# Patient Record
Sex: Male | Born: 1975 | Hispanic: Yes | Marital: Single | Smoking: Never smoker
Health system: Southern US, Community
[De-identification: ages and names within clinical notes are randomized; demographics above are authoritative.]

## PROBLEM LIST (undated history)

## (undated) DIAGNOSIS — E119 Type 2 diabetes mellitus without complications: Secondary | ICD-10-CM

---

## 2015-11-03 ENCOUNTER — Emergency Department (HOSPITAL_COMMUNITY)
Admission: EM | Admit: 2015-11-03 | Discharge: 2015-11-04 | Disposition: A | Discharge status: Home / Self Care | Payer: Self-pay | Attending: Physician Assistant | Admitting: Physician Assistant

## 2015-11-03 ENCOUNTER — Encounter (HOSPITAL_COMMUNITY): Payer: Self-pay

## 2015-11-03 DIAGNOSIS — R748 Abnormal levels of other serum enzymes: Secondary | ICD-10-CM | POA: Insufficient documentation

## 2015-11-03 DIAGNOSIS — R300 Dysuria: Secondary | ICD-10-CM | POA: Insufficient documentation

## 2015-11-03 DIAGNOSIS — R52 Pain, unspecified: Secondary | ICD-10-CM

## 2015-11-03 DIAGNOSIS — E119 Type 2 diabetes mellitus without complications: Secondary | ICD-10-CM | POA: Insufficient documentation

## 2015-11-03 DIAGNOSIS — N50819 Testicular pain, unspecified: Secondary | ICD-10-CM | POA: Insufficient documentation

## 2015-11-03 DIAGNOSIS — E139 Other specified diabetes mellitus without complications: Secondary | ICD-10-CM

## 2015-11-03 LAB — CBC
HEMATOCRIT: 43.8 % (ref 39.0–52.0)
HEMOGLOBIN: 15.9 g/dL (ref 13.0–17.0)
MCH: 33 pg (ref 26.0–34.0)
MCHC: 36.3 g/dL — AB (ref 30.0–36.0)
MCV: 90.9 fL (ref 78.0–100.0)
Platelets: 253 10*3/uL (ref 150–400)
RBC: 4.82 MIL/uL (ref 4.22–5.81)
RDW: 11.8 % (ref 11.5–15.5)
WBC: 5.1 10*3/uL (ref 4.0–10.5)

## 2015-11-03 LAB — COMPREHENSIVE METABOLIC PANEL
ALBUMIN: 4 g/dL (ref 3.5–5.0)
ALK PHOS: 85 U/L (ref 38–126)
ALT: 107 U/L — ABNORMAL HIGH (ref 17–63)
ANION GAP: 10 (ref 5–15)
AST: 108 U/L — ABNORMAL HIGH (ref 15–41)
BILIRUBIN TOTAL: 0.7 mg/dL (ref 0.3–1.2)
BUN: 11 mg/dL (ref 6–20)
CALCIUM: 9.1 mg/dL (ref 8.9–10.3)
CO2: 26 mmol/L (ref 22–32)
Chloride: 98 mmol/L — ABNORMAL LOW (ref 101–111)
Creatinine, Ser: 0.74 mg/dL (ref 0.61–1.24)
GFR calc Af Amer: >60 mL/min (ref >60)
GLUCOSE: 433 mg/dL — AB (ref 65–99)
POTASSIUM: 4.3 mmol/L (ref 3.5–5.1)
Sodium: 134 mmol/L — ABNORMAL LOW (ref 135–145)
TOTAL PROTEIN: 7 g/dL (ref 6.5–8.1)

## 2015-11-03 LAB — URINE MICROSCOPIC-ADD ON
RBC / HPF: NONE SEEN RBC/hpf (ref 0–5)
WBC, UA: NONE SEEN WBC/hpf (ref 0–5)

## 2015-11-03 LAB — URINALYSIS, ROUTINE W REFLEX MICROSCOPIC
BILIRUBIN URINE: NEGATIVE
Glucose, UA: >1000 mg/dL — AB
HGB URINE DIPSTICK: NEGATIVE
Ketones, ur: NEGATIVE mg/dL
Leukocytes, UA: NEGATIVE
Nitrite: NEGATIVE
PH: 6 (ref 5.0–8.0)
Protein, ur: NEGATIVE mg/dL
SPECIFIC GRAVITY, URINE: 1.04 — AB (ref 1.005–1.030)

## 2015-11-03 LAB — LIPASE, BLOOD: Lipase: 46 U/L (ref 11–51)

## 2015-11-03 NOTE — ED Notes (Signed)
Pt here with c/o of RLQ abdominal pain onset Sunday and pain radiates down to his testicles. Pt denies N/V/D but does report dysuria.

## 2015-11-04 ENCOUNTER — Emergency Department (HOSPITAL_COMMUNITY): Payer: Self-pay

## 2015-11-04 MED ORDER — FLUCONAZOLE 100 MG PO TABS
150.0000 mg | ORAL_TABLET | Freq: Once | ORAL | Status: AC
  Administered 2015-11-04: 150 mg via ORAL
  Filled 2015-11-04: qty 2

## 2015-11-04 NOTE — Discharge Instructions (Signed)
Tratamientos mdicos complementarios y alternativos para la diabetes (Complementary and Alternative Medical Therapies for Diabetes) Los medicamentos complementarios y alternativos son productos o prcticas de atencin mdica que no siempre se aceptan como parte de un medicamento de rutina. El medicamento complementario se [place] junto con el medicamento de rutina (tratamiento mdico). En ocasiones, el medicamento alternativo puede usarse en lugar del medicamento de [place]. Algunas personas usan estos mtodos para tratar la diabetes. Si bien algunos de estos tratamientos pueden ser eficaces, es posible que otros no lo sean. Incluso, algunos pueden ser perjudiciales. Los pacientes que usen estos mtodos deben informrselo al mdico. Es importante informar al mdico lo que est haciendo. Algunos de estos tratamientos se analizan a continuacin. Para obtener ms informacin, hable con su mdico. TRATAMIENTOS Acupuntura La acupuntura consiste en la insercin de agujas por parte de un profesional en determinados puntos de la piel. Algunos cientficos creen que esto desencadena la liberacin de los analgsicos naturales del cuerpo. Se ha demostrado que este tratamiento alivia el dolor prolongado (crnico), y puede ayudar a los pacientes que tienen lesiones nerviosas dolorosas a causa de la diabetes. Biorretroalimentacin La biorretroalimentacin ayuda a la persona a ser ms consciente de la respuesta que tiene el cuerpo al Merck & Co. Tambin lo ayuda a Software engineer. Este tratamiento alternativo se centra en las tcnicas de relajacin y reduccin del estrs. Una de las tcnicas consiste en generar imgenes mentales apacibles (visualizacin [place]). Algunas personas creen que ests imgenes pueden aliviar su afeccin. MEDICAMENTOS Cromo Diversos estudios [employer] suplementos de cromo pueden mejorar el control de la diabetes. El cromo [place] a Teaching laboratory technician de la [place]. Aunque las investigaciones  no han demostrado esto con Civil engineer, contracting. Los suplementos no se han recomendado ni aprobado. Debe tener precaucin si tiene problemas de rin (renales). Ginseng Hay diversos tipos de plantas de ginseng. El ginseng americano se [place] para los AMR Corporation la diabetes. Estos estudios han demostrado algunos efectos de reduccin de los niveles de [place]. Estos Bear Stearns niveles de glucosa en sangre se han observado en ayunas y despus de las comidas. Tambin se han observado en los niveles de A1c (niveles promedio de glucosa en sangre en un perodo de tres meses). Es necesario realizar ms estudios a largo plazo antes de Arts administrator para el uso de ginseng. Magnesio Durante Lexmark International, los expertos han estudiado la relacin que existe entre el magnesio y la diabetes. Sin embargo, an no se Corporate treasurer. Los MetLife sugieren que un nivel bajo de magnesio puede empeorar el control del nivel de glucosa en sangre en la diabetes tipo 2. Las investigaciones tambin demuestran que un nivel bajo puede generar determinadas complicaciones de la diabetes. Un estudio Jacobs Engineering personas que consumen ms magnesio tienen menos riesgo de sufrir de diabetes tipo 2. Ingerir cereales integrales, frutos secos y verduras de hojas verdes aumenta el nivel de [place]. Vanadio El vanadio es un compuesto que se encuentra en pequeas cantidades en las plantas y [place]. Los primeros estudios demostraron que el vanadio mejor los niveles de glucosa en sangre en los animales con diabetes tipo 1 y diabetes tipo 2. Un estudio Omnicare al recibir vanadio, los que tenan diabetes pudieron reducir la dosis de [place]. Los investigadores an deben aprender cmo funciona en el cuerpo, para descubrir los efectos secundarios y Editor, commissioning dosis seguras. Canela Se han realizado un par de estudios que parecen indicar que la canela reduce la resistencia a la insulina y la produccin de  insulina. Al hacerlo, puede  reducir el nivel de glucosa en [place]. Se desconocen las dosis exactas, pero puede tener un mejor efecto cuando se [place] junto con otros medicamentos para la diabetes.   Esta informacin no tiene Theme park manager el consejo del mdico. Asegrese de hacerle al mdico cualquier pregunta que tenga.   Document Released: 08/28/2013 Elsevier Interactive Patient Education 2016 ArvinMeritor. Control del nivel de glucosa en la sangre - Adultos (Blood Glucose Monitoring, Adult) El control de la glucosa en la sangre (tambin llamada azcar en la sangre) lo ayudar a tener la diabetes bajo control. Tambin ayuda a que usted y Lexicographer la diabetes y determinen si el tratamiento es Engineer, manufacturing. POR QU HAY QUE CONTROLAR LA GLUCOSA EN LA SANGRE?  Esto puede ayudar a comprender de United Stationers, la actividad fsica y los medicamentos inciden en los niveles de [place].  Le permite conocer el nivel de glucosa en la sangre en cualquier momento dado. Puede saber rpidamente si el nivel es bajo (hipoglucemia) o alto (hiperglucemia).  Puede ser de ayuda para que usted y el mdico sepan cmo Presenter, broadcasting,  y para entender cmo controlar una enfermedad o ajustar los medicamentos para hacer ejercicio. CUNDO DEBE HACERSE LAS PRUEBAS? El mdico lo ayudar a decidir con qu frecuencia deber AGCO Corporation niveles de glucosa en la [place]. Esto puede depender del tipo de diabetes que tenga, su control de la diabetes o los tipos de medicamentos que tome. Asegrese de anotar todos los valores de la glucosa en la [place], de modo que esta informacin pueda ser revisada por su mdico. A continuacin puede ver ejemplos de los momentos para Education officer, environmental la prueba que el mdico puede Neurosurgeon. Diabetes tipo1  Mdaselo al menos 2 veces al da si la diabetes est bien controlada, si [place] una bomba de insulina o si se aplica muchas inyecciones diarias.  Si la diabetes no est bien controlada o si est  enfermo, puede ser necesario que se controle con ms frecuencia.  Es recomendable que tambin lo mida en estas oportunidades:  Antes de cada inyeccin de insulina.  Antes y despus de hacer ejercicio.  Entre las comidas y 2horas despus de Arts administrator.  Ocasionalmente, entre las 2:00a.m. y las 3:00a.m. Diabetes tipo2  Si est utilizando insulina, realice la medicin al menos 2 veces al C.H. Robinson Worldwide. Sin embargo, es Insurance claims handler medicin antes de cada inyeccin de [place].  Si toma medicamentos por boca (va oral), hgase la prueba 2veces por da.  Si sigue una dieta controlada, hgase la prueba una vez por da.  Si la diabetes no est bien controlada o si est enfermo, puede ser necesario que se controle con ms frecuencia. CMO CONTROLAR EL NIVEL DE GLUCOSA EN LA SANGRE Insumos necesarios  Medidor de glucosa en la sangre.  Tiras reactivas para el medidor. Cada medidor tiene sus propias tiras reactivas. Debe usar las tiras reactivas correspondientes a su medidor.  Una aguja para pinchar (lanceta).  Un dispositivo que sujeta la lanceta (dispositivo de puncin).  Un diario o libro de anotaciones para YRC Worldwide. Procedimiento  Lave sus manos con agua y [place]. No se recomienda usar alcohol.  Pnchese el costado del dedo (no la punta) con Optometrist.  Apriete suavemente el dedo hasta que aparezca una pequea gota de [place].  Siga las instrucciones que vienen con el medidor para Public affairs consultant tira Firefighter, Contractor la sangre sobre la tira y usar el medidor de Horticulturist, commercial.  Otras zonas de las que se puede tomar sangre para la prueba Algunos medidores le permiten tomar sangre para la prueba de otras zonas del cuerpo (que no son el dedo). Estas reas se llaman sitios alternativos. Los sitios alternativos ms comunes son los siguientes:  El Product managerantebrazo.  El muslo.  La zona posterior de la parte inferior de la pierna.  La palma de la mano. El flujo de sangre en  estas zonas es ms lento. Por lo tanto, los valores de glucosa en la sangre que obtenga pueden estar demorados, y los nmeros son diferentes de los que obtiene de los dedos. No saque sangre de sitios alternativos si cree que tiene hipoglucemia. Los valores no sern precisos. Siempre extraiga del dedo si tiene hipoglucemia. Adems, si no puede darse cuenta cuando tiene bajos los niveles (hipoglucemia asintomtica), siempre extraiga sangre de los dedos para los controles de glucosa en la [place]. CONSEJOS ADICIONALES PARA EL CONTROL DE LA GLUCOSA  No vuelva a utilizar las lancetas.  Siempre tenga los insumos a mano.  Todos los medidores de glucosa incluyen un nmero de telfono "directo", disponible las 24 horas, al que podr llamar si tiene preguntas o French Southern Territoriesnecesita ayuda.  Ajuste (calibre) el medidor de glucosa con una solucin de control despus de terminar algunas cajas de tiras reactivas. LLEVE REGISTROS DE LOS NIVELES DE GLUCOSA EN LA SANGRE Es recomendable llevar un diario o un registro de los valores de glucosa en la [place]. La Harley-Davidsonmayora de los medidores de glucosa, sino todos, conservan el registro de la glucosa en el dispositivo. Algunos medidores permiten descargar los registros a su computadora. Llevar un registro de los valores de glucosa en la sangre es especialmente til si desea observar los patrones. Haga anotaciones simultneas con la Microbiologistlectura de los valores de glucosa en la sangre debido a que podra olvidar lo que ocurri en el momento exacto. Llevar un buen registro los ayudar a usted y al mdico a Printmakertrabajar juntos para Personnel officerlograr un buen control de la diabetes.    Esta informacin no tiene Theme park managercomo fin reemplazar el consejo del mdico. Asegrese de hacerle al mdico cualquier pregunta que tenga.   Document Released: 11/07/2005 Document Revised: 11/28/2014 Elsevier Interactive Patient Education Yahoo! Inc2016 Elsevier Inc.   Emergency Department Resource Guide 1) Find a Doctor and Pay Out of  Pocket Although you won't have to find out who is covered by your insurance plan, it is a good idea to ask around and get recommendations. You will then need to call the office and see if the doctor you have chosen will accept you as a new patient and what types of options they offer for patients who are self-pay. Some doctors offer discounts or will set up payment plans for their patients who do not have insurance, but you will need to ask so you aren't surprised when you get to your appointment.  2) Contact Your Local Health Department Not all health departments have doctors that can see patients for sick visits, but many do, so it is worth a call to see if yours does. If you don't know where your local health department is, you can check in your phone book. The CDC also has a tool to help you locate your state's health department, and many state websites also have listings of all of their local health departments.  3) Find a Walk-in Clinic If your illness is not likely to be very severe or complicated, you may want to try a walk in clinic. These are  popping up all over the country in pharmacies, drugstores, and shopping centers. They're usually staffed by nurse practitioners or physician assistants that have been trained to treat common illnesses and complaints. They're usually fairly quick and inexpensive. However, if you have serious medical issues or chronic medical problems, these are probably not your best option.  No Primary Care Doctor: - Call Health Connect at  [phone] - they can help you locate a primary care doctor that  accepts your insurance, provides certain services, etc. - Physician Referral Service- [phone]  Chronic Pain Problems: Organization         Address  Phone   Notes  [facility]  [phone] Patients need to be referred by their primary care doctor.   Medication Assistance: Organization         Address  Phone   Notes  [facility]  Medication [facility] [address] [place] [place], [place] [address] [phone] --Must be a resident of [facility] -- Must have NO insurance coverage whatsoever (no Medicaid/ Medicare, etc.) -- The pt. MUST have a primary care doctor that directs their care regularly and follows them in the community   MedAssist  [phone]   [employer]  [phone]    Agencies that provide inexpensive medical care: Organization         Address  Phone   Notes  [facility]  [phone]   [facility]    [phone]   [facility] [address] [place], [place] [address] [phone]   [facility] [address] [place], [place] [phone]   [facility]anned Parenthood    [phone]   [facility]    [phone]   [facility] and [facility]  [address], [place] Phone:  [phone], Fax:  [phone] Hours of Operation:  9 am - 6 pm, M-F.  Also accepts Medicaid/Medicare and self-pay.  [facility]  [address], [place] Phone: [phone], Fax: [phone]. Hours of Operation:  8:30 am - 5:30 pm, M-F.  Also accepts Medicaid and self-pay.  [facility] [place] [address] [place] Phone: [phone]   [facility] [address] Natasha Bence [place], [place] [phone], Ext. [phone] Mondays & Thursdays: 7-9 AM.  First 15 patients are seen on a first come, first serve basis.    Medicaid-accepting [facility] Providers:  Organization         Address  Phone   Notes  [facility] [address], [place] [phone] Also accepts self-pay patients.  [facility] [address] Laurell Josephs [place], [place]  [phone]   [facility] [address], [place] [phone]   [facility] [address] [place] [phone]   Renaye Rakers [address], [place]   [phone] Only accepts [place] Access [place] patients after they have their name applied to their card.   Self-Pay (no insurance) in [facility]:  Organization         Address  Phone   Notes  Sickle Cell Patients, [facility] [address] [place], [place] [phone]   [facility] [address] [place], [place] [phone]   [facility]  [address], [place] [phone]   [facility]/Dr. Osei-Bonsu  [address], [place] or [address], [place] [phone] Phone number for both [place] and [place] locations is the same.  [facility] and [facility] [address], [place] [phone]   [facility] [address], [place] or [address] [phone]   [facility] [address] [place] [phone], phone; [phone], fax Sees patients 1st and 3rd Saturday of every month.  Must not qualify for public or private insurance (i.e. Medicaid, Medicare, [place] Health Choice, Veterans' Benefits)  Household income should be no more than 200% of the poverty level The clinic cannot treat you if you are pregnant or think you are pregnant  Sexually transmitted diseases are not treated at the clinic.    Dental Care: Organization         Address  Phone  Notes  [facility] Department of [facility] [address] [place], [place] [phone] Accepts children up to age 78 who are enrolled in [place] or [place] Health Choice; pregnant women with a Medicaid card; and children who have applied for Medicaid or [place] Health Choice, but were declined, whose parents can pay a reduced fee at time of service.  [facility] Department of [facility]  [address], [place]  [phone] Accepts children up to age 84 who are enrolled in [place] or [place] Health Choice; pregnant women with a Medicaid card; and children who have applied for Medicaid or [place] Health Choice, but were declined, whose parents can pay a reduced fee at time of service.  [facility]  [address] [place], [place] [phone] Patients are seen by appointment only. Walk-ins are not accepted. [facility] will see patients 38 years of age and older. Monday - Tuesday (8am-5pm) Most Wednesdays (8:30-5pm) $30 per visit, cash only  [facility]  [address], [facility] [phone] Patients are seen by appointment only. Walk-ins are not accepted. [facility] will see patients 40 years of age and older. One Wednesday Evening (Monthly: Volunteer Based).  $30 per visit, cash only  [employer] of [employer]  [phone] for adults; Children under age 2, call Graduate Pediatric Dentistry at [phone]. Children aged 11-14, please call [phone] to request a pediatric application.  Dental services are provided in all areas of dental care including fillings, crowns and bridges, complete and partial dentures, implants, gum treatment, root canals, and extractions. Preventive care is also provided. Treatment is provided to both adults and children. Patients are selected via a lottery and there is often a waiting list.   [facility] [address] [place]  [phone] [email or web address]   [facility] [address] [place], [place] [phone], Ext. [phone] Second and Fourth Thursday of each month, opens at 6:30 AM; Clinic ends at 9 AM.  Patients are seen on a first-come first-served basis, and a limited number are seen during each clinic.   [facility]  [address] Ether [place] [employer], [place] [phone]   Eligibility Requirements You must have lived in [place], [place], or [place]  counties for at least the last three months.   You cannot be eligible for state or federal sponsored [place], including CIGNA, [place],  or Medicare.   You generally cannot be eligible for healthcare insurance through your employer.    How to apply: Eligibility screenings are held every Tuesday and Wednesday afternoon from 1:00 pm until 4:00 pm. You do not need an appointment for the interview!  [facility] [address], [place], [place] [phone]   [facility] Health Department  [phone]   [facility] Health Department  [phone]   [facility] Health Department  [phone]    Behavioral Health Resources in the Community: Intensive Outpatient Programs Organization         Address  Phone  Notes  [facility] [address], [place], [place] [phone]   [facility] Outpatient [address] [place], [place] [phone]   [facility]: [facility] [address] [place], [place]  [phone]   [facility] [address],  [place], [place] [phone] or [phone]   Substance Abuse Resources Organization         Address  Phone  Notes  Al[facility] Drug Services  [phone]   [facility]  [phone]   [facility] [place]  [phone]   Floydene Flock  [phone]   Residential & Outpatient Substance Abuse Program  [phone]   Psychological Services Organization         Address  Phone  Notes  [facility]  [phone]   [facility] Services  [phone]   [facility] Mental Health [address] [place] [phone] or [phone]    Mobile Crisis Teams Organization         Address  Phone  Notes  [facility], Mobile Crisis Care Unit  [phone]   [facility]  [address] [place], [place] [phone]   Doristine Locks [address] [place]  [place] [phone]    Self-Help/Support Groups Organization         Address  Phone             Notes  [facility] [place] - variety of support groups  [phone]- I7437963 Call for more information  Narcotics Anonymous (NA), [facility] [address], [employer] [place]  2 meetings at this location   Statistician         Address  Phone  Notes  [facility] [address],    [place]  [phone]   [facility]  [address], [place] [address], [place], [place] [phone]   [facility] [address] [place], [place] [phone] Admissions: 8am-3pm M-F  [facility] [address]    [place], [place] [phone]   [facility] [address] [place], [place], [place] [phone]   [facility] [address]  [place], [place] [phone]   [facility] - Intensive Outpatient [address] Laurell Josephs [address], [place], [place] [phone]   [facility] ([facility]iction [facility] [address] [place]  [place], [place] [phone] or [phone]   [facility] ([facility]) [address] [place], [place] [phone] Accepts Medicaid  [facility] [address],  [place] [phone] Substance Abuse/Addiction Treatment   [facility] Organization         Address  Phone  Notes  [facility]  [phone]   Angie Fava, PhD [address]yn Skeeters [place], [place]   [phone] or [phone]   [facility]  Behavioral   [address] [place], [place] [phone]   [facility] [address] [place], [place] [phone] Insurance/Medicaid/sponsorship through [facility] [address]                                    [place], [place] [phone] Therapy/tele-psych/case  [facility] [address]   The [place], [place] [phone]    Dr. Lolly Mustache  [phone]   [facility]  United Way [facility] Dept. 1) [address] [place] 2) [address], [place] 3)  [address], [place] [phone]  [phone]   [facility] Child Abuse Hotline 989-034-5731 or [phone] (After Hours)

## 2015-11-04 NOTE — ED Notes (Signed)
Pt verbalized understanding of d/c instructions and follow-up care. No further questions/concerns, VSS, ambulatory w/ steady gait (refused wheelchair) 

## 2015-11-04 NOTE — ED Provider Notes (Signed)
CSN: [id number]     Arrival date & time 11/03/15  2213 History  By signing my name below, I, Bethel Born, attest that this documentation has been prepared under the direction and in the presence of Arzella Rehmann Randall An, MD. Electronically Signed: Bethel Born, ED Scribe. 11/04/2015. 2:41 AM    Chief Complaint  Patient presents with  . Abdominal Pain    The history is provided by the patient. A language interpreter was used (A friend at bedside.).   Dillon Knight is a 39 y.o. male who presents to the Emergency Department complaining of new, intermittent, sharp, right lower abdominal pain with urinating onset 3 days ago. The pain radiates into the right testicle more than the left. When the pain is present he cannot urinate. Associated symptoms mild dysuria. Pt denies fever, nausea, and vomiting.   History reviewed. No pertinent past medical history. History reviewed. No pertinent past surgical history. No family history on file. Social History  Substance Use Topics  . Smoking status: Never Smoker   . Smokeless tobacco: None  . Alcohol Use: Yes    Review of Systems  Constitutional: Negative for fever.  Gastrointestinal: Positive for abdominal pain. Negative for nausea, vomiting and diarrhea.  Genitourinary: Positive for dysuria, difficulty urinating and testicular pain.  All other systems reviewed and are negative.  Allergies  Review of patient's allergies indicates no known allergies.  Home Medications   Prior to Admission medications   Not on File   BP 119/80 mmHg  Pulse 72  Temp(Src) 98.3 F (36.8 C) (Oral)  Resp 18  SpO2 96% Physical Exam  Constitutional: He is oriented to person, place, and time. He appears well-developed and well-nourished.  HENT:  Head: Normocephalic and atraumatic.  Eyes: EOM are normal.  Neck: Normal range of motion.  Cardiovascular: Normal rate, regular rhythm, normal heart sounds and intact distal pulses.    Pulmonary/Chest: Effort normal and breath sounds normal. No respiratory distress.  CTAB  Abdominal: Soft. He exhibits no distension. There is no tenderness.  Abdomen non tender.  Genitourinary:  Right groin tenderness.   Musculoskeletal: Normal range of motion.  Neurological: He is alert and oriented to person, place, and time.  Skin: Skin is warm and dry.  Psychiatric: He has a normal mood and affect. Judgment normal.  Nursing note and vitals reviewed.   ED Course  Procedures (including critical care time) DIAGNOSTIC STUDIES: Oxygen Saturation is 96% on RA,  normal by my interpretation.    COORDINATION OF CARE: 1:31 AM Discussed treatment plan with pt at bedside and pt agreed to plan.  Labs Review Labs Reviewed  COMPREHENSIVE METABOLIC PANEL - Abnormal; Notable for the following:    Sodium 134 (*)    Chloride 98 (*)    Glucose, Bld 433 (*)    AST 108 (*)    ALT 107 (*)    All other components within normal limits  CBC - Abnormal; Notable for the following:    MCHC 36.3 (*)    All other components within normal limits  URINALYSIS, ROUTINE W REFLEX MICROSCOPIC (NOT AT [facility]) - Abnormal; Notable for the following:    Specific Gravity, Urine 1.040 (*)    Glucose, UA >1000 (*)    All other components within normal limits  URINE MICROSCOPIC-ADD ON - Abnormal; Notable for the following:    Squamous Epithelial / LPF 0-5 (*)    Bacteria, UA RARE (*)    All other components within normal limits  LIPASE, BLOOD  Imaging Review [place] Scrotum  11/04/2015  CLINICAL DATA:  Acute onset of right lower quadrant abdominal pain, radiating down to the testes. Initial encounter. EXAM: SCROTAL ULTRASOUND DOPPLER ULTRASOUND OF THE TESTICLES TECHNIQUE: Complete ultrasound examination of the testicles, epididymis, and other scrotal structures was performed. Color and spectral Doppler ultrasound were also utilized to evaluate blood flow to the testicles. COMPARISON:  None. FINDINGS: Right  testicle Measurements: 4.6 x 2.6 x 2.7 cm. No mass or microlithiasis visualized. Left testicle Measurements: 4.3 x 2.4 x 2.9 cm. No mass or microlithiasis visualized. Right epididymis:  Normal in size and appearance. Left epididymis:  Normal in size and appearance. Hydrocele:  None visualized. Varicocele:  None visualized. Pulsed Doppler interrogation of both testes demonstrates normal low resistance arterial and venous waveforms bilaterally. IMPRESSION: Unremarkable scrotal ultrasound.  No evidence of testicular torsion. Electronically Signed   By: Roanna RaiderJeffery  Chang M.D.   On: 11/04/2015 03:39   [place] Art/ven Flow Abd Pelv Doppler  11/04/2015  CLINICAL DATA:  Acute onset of right lower quadrant abdominal pain, radiating down to the testes. Initial encounter. EXAM: SCROTAL ULTRASOUND DOPPLER ULTRASOUND OF THE TESTICLES TECHNIQUE: Complete ultrasound examination of the testicles, epididymis, and other scrotal structures was performed. Color and spectral Doppler ultrasound were also utilized to evaluate blood flow to the testicles. COMPARISON:  None. FINDINGS: Right testicle Measurements: 4.6 x 2.6 x 2.7 cm. No mass or microlithiasis visualized. Left testicle Measurements: 4.3 x 2.4 x 2.9 cm. No mass or microlithiasis visualized. Right epididymis:  Normal in size and appearance. Left epididymis:  Normal in size and appearance. Hydrocele:  None visualized. Varicocele:  None visualized. Pulsed Doppler interrogation of both testes demonstrates normal low resistance arterial and venous waveforms bilaterally. IMPRESSION: Unremarkable scrotal ultrasound.  No evidence of testicular torsion. Electronically Signed   By: Roanna RaiderJeffery  Chang M.D.   On: 11/04/2015 03:39   [place] Abdomen Limited Ruq  11/04/2015  CLINICAL DATA:  Acute onset of elevated LFTs.  Initial encounter. EXAM: US ABDOMEN LIMITED - RIGHT UPPER QUADRANT COMPARISON:  None. FINDINGS: Gallbladder: No gallstones or wall thickening visualized. No sonographic Murphy sign  noted. Common bile duct: Diameter: 0.3 cm, within normal limits in caliber. Liver: No focal lesion identified. Diffusely increased parenchymal echogenicity and coarsened echotexture, compatible with fatty infiltration. There is mild fatty sparing about the gallbladder fossa. IMPRESSION: 1. No acute abnormality seen at the right upper quadrant. 2. Diffuse fatty infiltration within the liver. Electronically Signed   By: Roanna RaiderJeffery  Chang M.D.   On: 11/04/2015 05:05   I have personally reviewed and evaluated these images and lab results as part of my medical decision-making.   EKG Interpretation None      MDM   Final diagnoses:  Pain  Elevated liver enzymes  Diabetes mellitus of other type without complication [facility](HCC)    Patient is very friendly 39 year old obese Hispanic male presenting with urinary symptoms. Patient has trouble urinating occasionally in pain with urination. Feels like the pain may be going into his right testicle. Although physical exam is normal we will get ultrasound of right testicle No abdomnal pain, maybe a tiny bit of groin pain, no evidence of hernia. He has had no swelling. . Labs showed elevated AST ALT, US of  Right Upper Quadrant Was Normal.  I'm concerned that the patient's symptoms may be from his diabetes. Patient has isolated blood sugar here of 488 which is consistent with a new guidelines for definition of diabetes. He has no anion gap and concerned about  DKA at this time. However patient has no follow-up.  We spnet lots of time to give him lots information about exercise and diet controlled diabetes. I'm concerned that his symptoms of urinary issues may be from a Candida infection. We will treat empirically here. Patient's eating and drinking normally, no fevers no abdominal pain.   I personally performed the services described in this documentation, which was scribed in my presence. The recorded information has been reviewed and is accurate.      Shalla Bulluck Randall An, MD 11/04/15 [phone]

## 2016-03-08 IMAGING — US US SCROTUM
1 series · 14 of 25 positions shown · non-contrast
Comparison: None.

CLINICAL DATA: Acute onset of right lower quadrant abdominal pain,
radiating down to the testes. Initial encounter.

EXAM:
SCROTAL ULTRASOUND
DOPPLER ULTRASOUND OF THE TESTICLES
TECHNIQUE: Complete ultrasound examination of the testicles, epididymis, and
other scrotal structures was performed. Color and spectral Doppler
ultrasound were also utilized to evaluate blood flow to the
testicles.

[Series 1: us scrotum · 0.06mm/px · 14 of 34 slices shown]
[im 1/34]
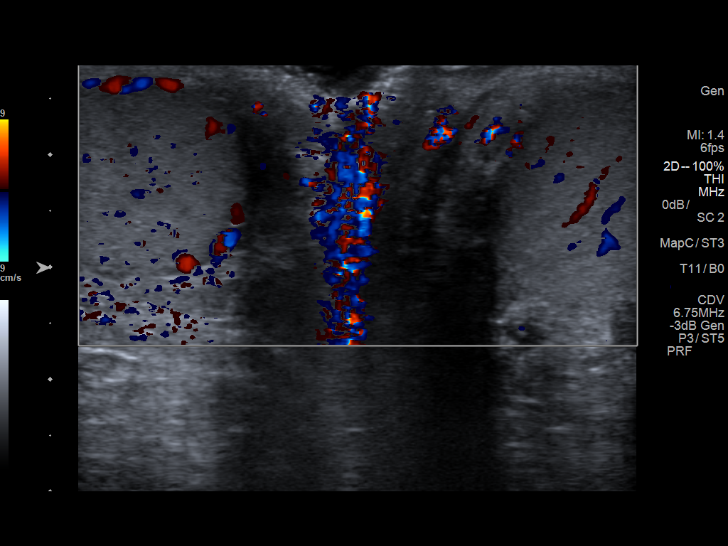
[im 3/34]
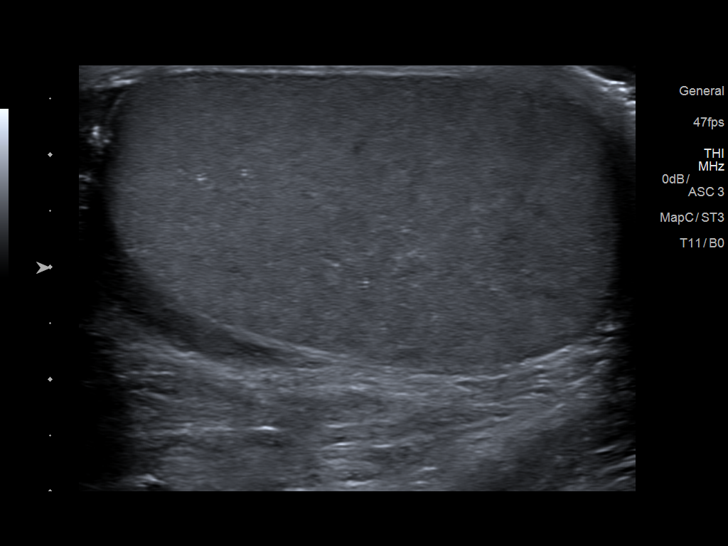
[im 6/34]
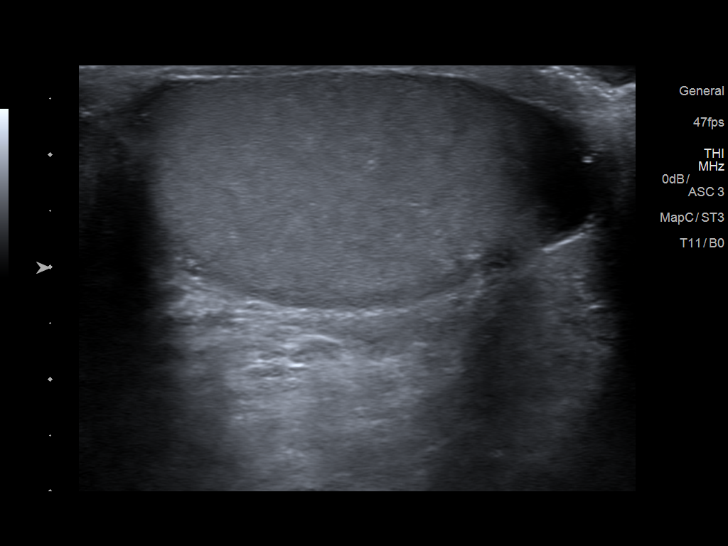
[im 9/34]
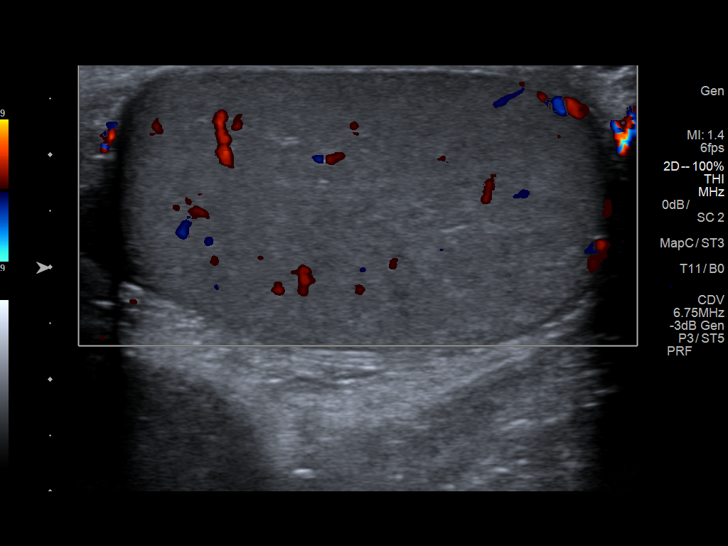
[im 12/34]
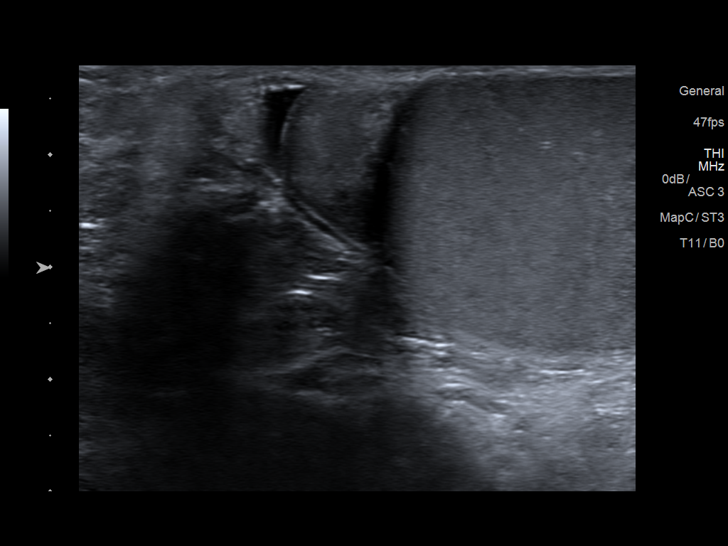
[im 13/34]
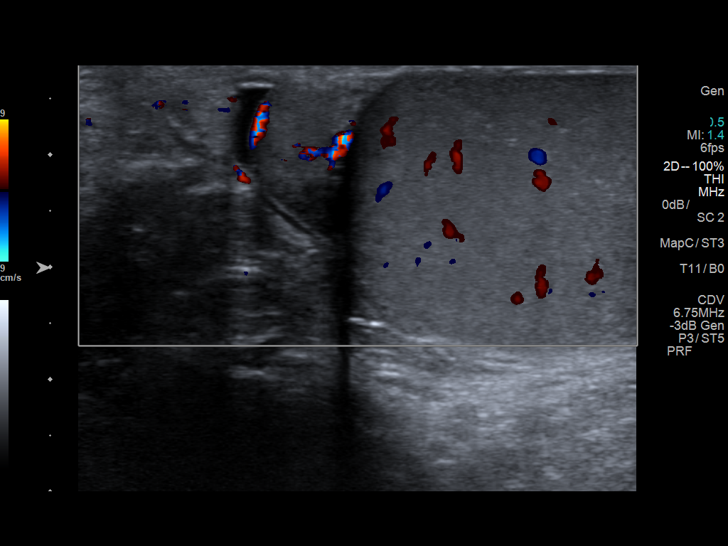
[im 16/34]
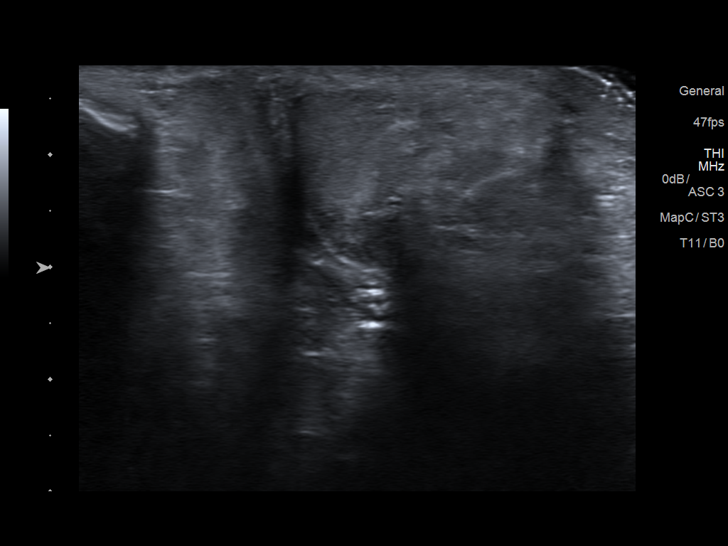
[im 18/34]
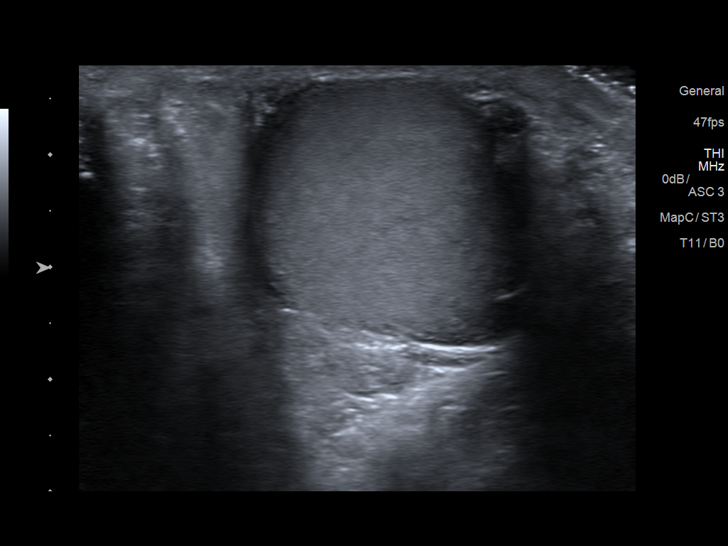
[im 21/34]
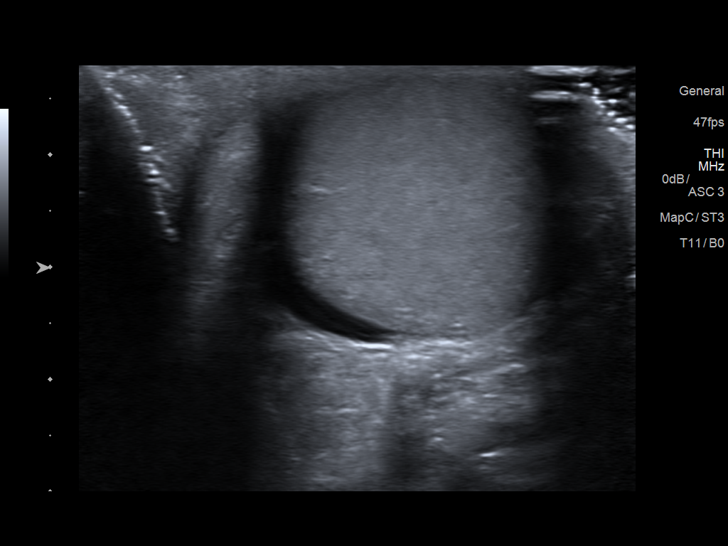
[im 23/34]
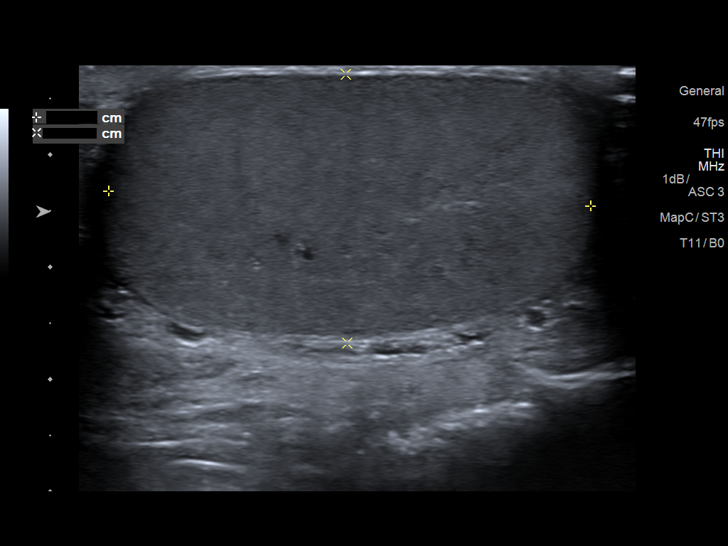
[im 25/34]
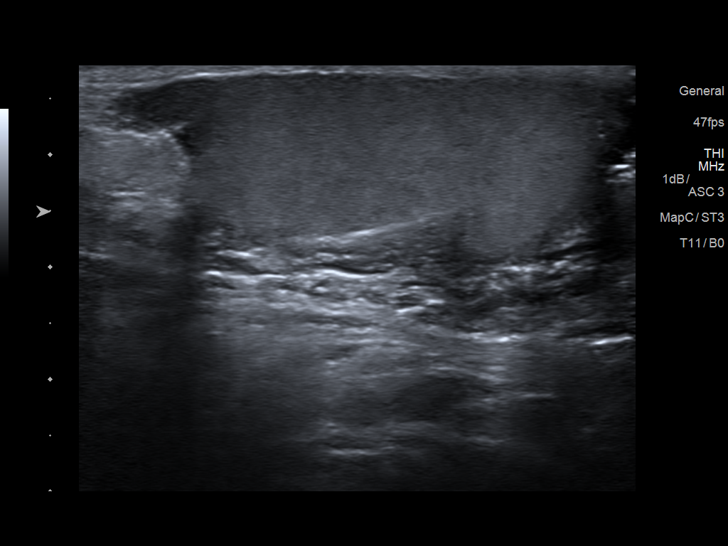
[im 28/34]
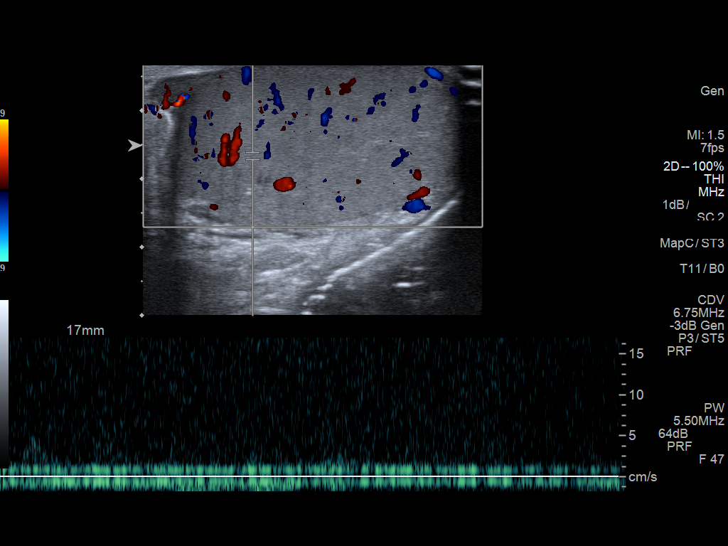
[im 31/34]
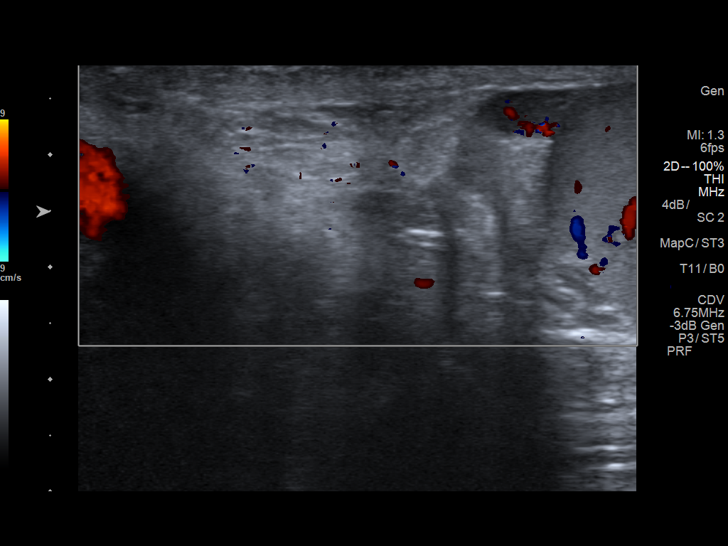
[im 34/34]
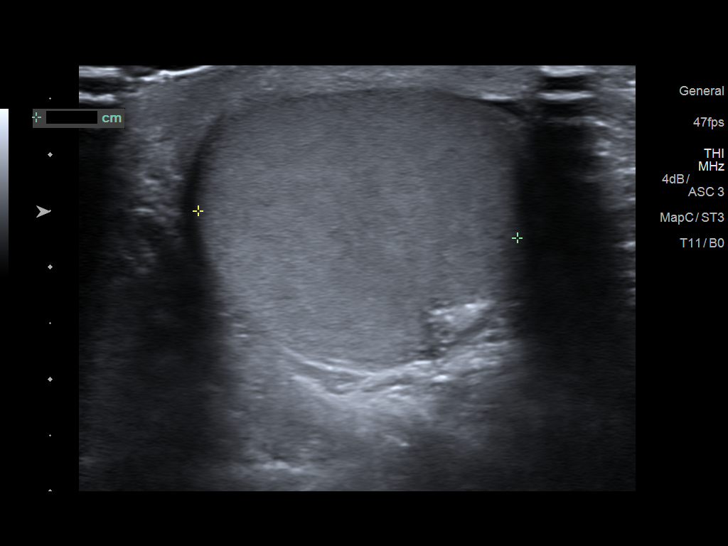

[14 of 25 positions shown; findings below may reference images not displayed]

FINDINGS: Right testicle

Measurements: 4.6 x 2.6 x 2.7 cm. No mass or microlithiasis
visualized.

Left testicle

Measurements: 4.3 x 2.4 x 2.9 cm. No mass or microlithiasis
visualized.

Right epididymis:  Normal in size and appearance.

Left epididymis:  Normal in size and appearance.

Hydrocele:  None visualized.

Varicocele:  None visualized.

Pulsed Doppler interrogation of both testes demonstrates normal low
resistance arterial and venous waveforms bilaterally.
IMPRESSION: Unremarkable scrotal ultrasound.  No evidence of testicular torsion.

## 2017-10-31 ENCOUNTER — Other Ambulatory Visit: Payer: Self-pay

## 2017-10-31 ENCOUNTER — Encounter (HOSPITAL_BASED_OUTPATIENT_CLINIC_OR_DEPARTMENT_OTHER): Payer: Self-pay | Admitting: Emergency Medicine

## 2017-10-31 ENCOUNTER — Emergency Department (HOSPITAL_BASED_OUTPATIENT_CLINIC_OR_DEPARTMENT_OTHER)
Admission: EM | Admit: 2017-10-31 | Discharge: 2017-10-31 | Disposition: A | Discharge status: Home / Self Care | Payer: No Typology Code available for payment source | Attending: Emergency Medicine | Admitting: Emergency Medicine

## 2017-10-31 DIAGNOSIS — Z7984 Long term (current) use of oral hypoglycemic drugs: Secondary | ICD-10-CM | POA: Diagnosis not present

## 2017-10-31 DIAGNOSIS — E119 Type 2 diabetes mellitus without complications: Secondary | ICD-10-CM | POA: Insufficient documentation

## 2017-10-31 DIAGNOSIS — M545 Low back pain: Secondary | ICD-10-CM | POA: Diagnosis not present

## 2017-10-31 DIAGNOSIS — M542 Cervicalgia: Secondary | ICD-10-CM | POA: Insufficient documentation

## 2017-10-31 HISTORY — DX: Type 2 diabetes mellitus without complications: E11.9

## 2017-10-31 MED ORDER — CYCLOBENZAPRINE HCL 10 MG PO TABS: 10.0000 mg | ORAL_TABLET | Freq: Two times a day (BID) | ORAL | 0 refills | Status: DC | PRN

## 2017-10-31 MED ORDER — NAPROXEN 500 MG PO TABS: 500.0000 mg | ORAL_TABLET | Freq: Two times a day (BID) | ORAL | 0 refills | Status: DC
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
## 2019-09-17 ENCOUNTER — Other Ambulatory Visit: Payer: Self-pay

## 2019-09-17 DIAGNOSIS — Z20822 Contact with and (suspected) exposure to covid-19: Secondary | ICD-10-CM

## 2019-09-19 LAB — NOVEL CORONAVIRUS, NAA: SARS-CoV-2, NAA: DETECTED — AB

## 2019-09-26 ENCOUNTER — Other Ambulatory Visit: Payer: Self-pay

## 2019-09-26 ENCOUNTER — Encounter (HOSPITAL_BASED_OUTPATIENT_CLINIC_OR_DEPARTMENT_OTHER): Payer: Self-pay

## 2019-09-26 ENCOUNTER — Emergency Department (HOSPITAL_BASED_OUTPATIENT_CLINIC_OR_DEPARTMENT_OTHER): Payer: Self-pay

## 2019-09-26 ENCOUNTER — Emergency Department (HOSPITAL_BASED_OUTPATIENT_CLINIC_OR_DEPARTMENT_OTHER)
Admission: EM | Admit: 2019-09-26 | Discharge: 2019-09-26 | Disposition: A | Discharge status: Home / Self Care | Payer: Self-pay | Attending: Emergency Medicine | Admitting: Emergency Medicine

## 2019-09-26 DIAGNOSIS — E119 Type 2 diabetes mellitus without complications: Secondary | ICD-10-CM | POA: Insufficient documentation

## 2019-09-26 DIAGNOSIS — J189 Pneumonia, unspecified organism: Secondary | ICD-10-CM | POA: Insufficient documentation

## 2019-09-26 DIAGNOSIS — R059 Cough, unspecified: Secondary | ICD-10-CM

## 2019-09-26 DIAGNOSIS — R05 Cough: Secondary | ICD-10-CM

## 2019-09-26 DIAGNOSIS — Z20828 Contact with and (suspected) exposure to other viral communicable diseases: Secondary | ICD-10-CM | POA: Insufficient documentation

## 2019-09-26 LAB — INFLUENZA PANEL BY PCR (TYPE A & B)
Influenza A By PCR: NEGATIVE
Influenza B By PCR: NEGATIVE

## 2019-09-26 MED ORDER — AZITHROMYCIN 250 MG PO TABS: ORAL_TABLET | ORAL | 0 refills | Status: DC

## 2019-09-26 NOTE — ED Provider Notes (Signed)
[facility] Emergency Department Provider Note MRN:  [id number]  Arrival date & time: 09/26/19     Chief Complaint   Cough (+covid 10/27)   History of Present Illness   Dillon Knight is a 43 y.o. year-old male with a history of diabetes, COVID-19 presenting to the ED with chief complaint of cough.  Diagnosed with COVID-19 last month, was experiencing mostly just fever, malaise.  Felt all the way better but then 4 days ago began having increased and return of cough.  Mild dull frontal headache as well.  Denies shortness of breath, no chest pain, no abdominal pain, no other symptoms.  Review of Systems  A complete 10 system review of systems was obtained and all systems are negative except as noted in the HPI and PMH.   Patient's Health History    Past Medical History:  Diagnosis Date  . Diabetes mellitus without complication ([place])     History reviewed. No pertinent surgical history.  No family history on file.  Social History   Socioeconomic History  . Marital status: Single    Spouse name: Not on file  . Number of children: Not on file  . Years of education: Not on file  . Highest education level: Not on file  Occupational History  . Not on file  Social Needs  . Financial resource strain: Not on file  . Food insecurity    Worry: Not on file    Inability: Not on file  . Transportation needs    Medical: Not on file    Non-medical: Not on file  Tobacco Use  . Smoking status: Never Smoker  . Smokeless tobacco: Never Used  Substance and Sexual Activity  . Alcohol use: Yes    Comment: occ  . Drug use: Never  . Sexual activity: Not on file  Lifestyle  . Physical activity    Days per week: Not on file    Minutes per session: Not on file  . Stress: Not on file  Relationships  . Social Herbalist on phone: Not on file    Gets together: Not on file    Attends religious service: Not on file    Active member of club or  organization: Not on file    Attends meetings of clubs or organizations: Not on file    Relationship status: Not on file  . Intimate partner violence    Fear of current or ex partner: Not on file    Emotionally abused: Not on file    Physically abused: Not on file    Forced sexual activity: Not on file  Other Topics Concern  . Not on file  Social History Narrative  . Not on file     Physical Exam  Vital Signs and Nursing Notes reviewed Vitals:   09/26/19 1736  BP: 107/72  Pulse: (!) 104  Resp: 20  Temp: 99.6 F (37.6 C)  SpO2: 95%    CONSTITUTIONAL: Well-appearing, NAD NEURO:  Alert and oriented x 3, no focal deficits EYES:  eyes equal and reactive ENT/NECK:  no LAD, no JVD CARDIO: Regular rate, well-perfused, normal S1 and S2 PULM:  CTAB no wheezing or rhonchi GI/GU:  normal bowel sounds, non-distended, non-tender MSK/SPINE:  No gross deformities, no edema SKIN:  no rash, atraumatic PSYCH:  Appropriate speech and behavior  Diagnostic and Interventional Summary    EKG Interpretation  Date/Time:    Ventricular Rate:    PR Interval:  QRS Duration:   QT Interval:    QTC Calculation:   R Axis:     Text Interpretation:        Labs Reviewed  INFLUENZA PANEL BY PCR (TYPE A & B)    XR Chest Single View  Final Result      Medications - No data to display   Procedures  /  Critical Care Procedures  ED Course and Medical Decision Making  I have reviewed the triage vital signs and the nursing notes.  Pertinent labs & imaging results that were available during my care of the patient were reviewed by me and considered in my medical decision making (see below for details).     Considering continued Covid symptoms versus secondary bacterial pneumonia, otherwise is well-appearing with no increased work of breathing, clear lungs, doubt VTE.  Awaiting chest x-ray.  Anticipating discharge.  Chest x-ray revealing some lingular atelectasis, which clinically I will  interpret as pneumonia given his increased cough after a viral illness, appears diaphoretic, possibly febrile.  Appropriate for outpatient management as he is with no increased work of breathing, no evidence of sepsis.  Elmer Sow. Pilar Plate, MD [facility] Emergency Medicine [facility] [email or web address]  Final Clinical Impressions(s) / ED Diagnoses     ICD-10-CM   1. Community acquired pneumonia, unspecified laterality  J18.9   2. Cough  R05 XR Chest Single View    XR Chest Single View    ED Discharge Orders         Ordered    azithromycin (ZITHROMAX) 250 MG tablet     09/26/19 2023           Discharge Instructions Discussed with and Provided to Patient:     Discharge Instructions     You were evaluated in the Emergency Department and after careful evaluation, we did not find any emergent condition requiring admission or further testing in the hospital.  Your exam/testing today is overall reassuring.  Your chest x-ray showed some evidence of pneumonia.  Please take the antibiotics as directed and drink plenty of fluids at home.  Please return to the Emergency Department if you experience any worsening of your condition.  We encourage you to follow up with a primary care provider.  Thank you for allowing [place] to be a part of your care.       Sabas Sous, MD 09/26/19 2024

## 2019-09-26 NOTE — ED Triage Notes (Addendum)
Pt c/o cough with +covid 10/27-pt pale and diaphoretic-pt with constant dry cough-taken to tx area via w/c-girlfriend interpreting

## 2019-09-26 NOTE — Discharge Instructions (Addendum)
You were evaluated in the Emergency Department and after careful evaluation, we did not find any emergent condition requiring admission or further testing in the hospital.  Your exam/testing today is overall reassuring.  Your chest x-ray showed some evidence of pneumonia.  Please take the antibiotics as directed and drink plenty of fluids at home.  Please return to the Emergency Department if you experience any worsening of your condition.  We encourage you to follow up with a primary care provider.  Thank you for allowing [place] to be a part of your care.

## 2019-10-07 ENCOUNTER — Other Ambulatory Visit: Payer: Self-pay

## 2019-10-07 DIAGNOSIS — Z20822 Contact with and (suspected) exposure to covid-19: Secondary | ICD-10-CM

## 2019-10-09 LAB — NOVEL CORONAVIRUS, NAA: SARS-CoV-2, NAA: NOT DETECTED

## 2020-11-08 IMAGING — DX DG CHEST 1V PORT
1 series · 1 of 1 positions shown · non-contrast
Comparison: None.

CLINICAL DATA: Cough, COVID

EXAM:
PORTABLE CHEST 1 VIEW

[chest ap]
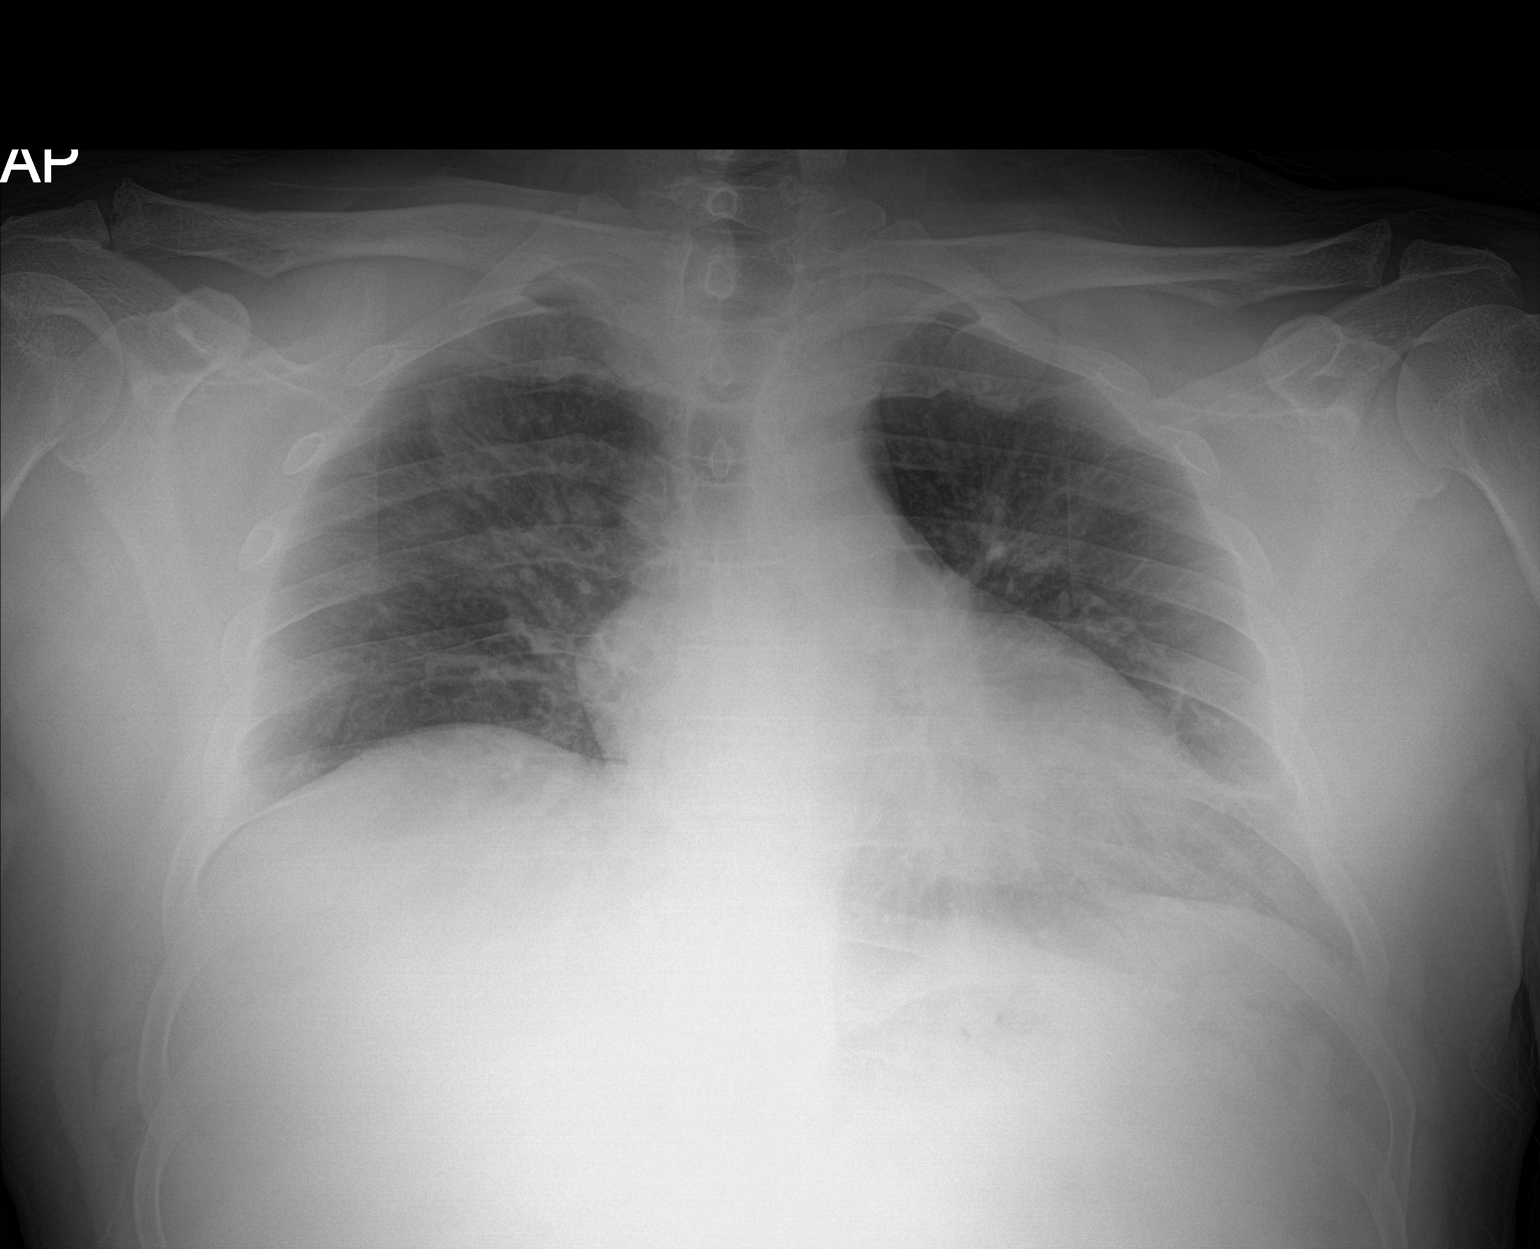

[1 of 1 positions shown; findings below may reference images not displayed]

FINDINGS: Low lung volumes. Linear lingular densities could reflect
atelectasis or scarring. Heart is upper limits normal in size,
accentuated by technique and low lung volumes. No effusions. No
acute bony abnormality.
IMPRESSION: Low lung volumes. Lingular atelectasis or scarring. No active
disease.

## 2021-08-20 ENCOUNTER — Emergency Department (HOSPITAL_BASED_OUTPATIENT_CLINIC_OR_DEPARTMENT_OTHER)
Admission: EM | Admit: 2021-08-20 | Discharge: 2021-08-21 | Disposition: A | Discharge status: Home / Self Care | Payer: Self-pay | Attending: Emergency Medicine | Admitting: Emergency Medicine

## 2021-08-20 ENCOUNTER — Encounter (HOSPITAL_BASED_OUTPATIENT_CLINIC_OR_DEPARTMENT_OTHER): Payer: Self-pay | Admitting: *Deleted

## 2021-08-20 ENCOUNTER — Other Ambulatory Visit: Payer: Self-pay

## 2021-08-20 ENCOUNTER — Emergency Department (HOSPITAL_BASED_OUTPATIENT_CLINIC_OR_DEPARTMENT_OTHER): Payer: Self-pay

## 2021-08-20 DIAGNOSIS — R1012 Left upper quadrant pain: Secondary | ICD-10-CM | POA: Insufficient documentation

## 2021-08-20 DIAGNOSIS — E119 Type 2 diabetes mellitus without complications: Secondary | ICD-10-CM | POA: Insufficient documentation

## 2021-08-20 DIAGNOSIS — Z794 Long term (current) use of insulin: Secondary | ICD-10-CM | POA: Insufficient documentation

## 2021-08-20 DIAGNOSIS — Z7984 Long term (current) use of oral hypoglycemic drugs: Secondary | ICD-10-CM | POA: Insufficient documentation

## 2021-08-20 LAB — CBC
HCT: 39.9 % (ref 39.0–52.0)
Hemoglobin: 14.3 g/dL (ref 13.0–17.0)
MCH: 32.1 pg (ref 26.0–34.0)
MCHC: 35.8 g/dL (ref 30.0–36.0)
MCV: 89.7 fL (ref 80.0–100.0)
Platelets: 247 10*3/uL (ref 150–400)
RBC: 4.45 MIL/uL (ref 4.22–5.81)
RDW: 11.9 % (ref 11.5–15.5)
WBC: 5 10*3/uL (ref 4.0–10.5)
nRBC: 0 % (ref 0.0–0.2)

## 2021-08-20 LAB — COMPREHENSIVE METABOLIC PANEL
ALT: 53 U/L — ABNORMAL HIGH (ref 0–44)
AST: 41 U/L (ref 15–41)
Albumin: 4.3 g/dL (ref 3.5–5.0)
Alkaline Phosphatase: 64 U/L (ref 38–126)
Anion gap: 7 (ref 5–15)
BUN: 13 mg/dL (ref 6–20)
CO2: 28 mmol/L (ref 22–32)
Calcium: 9 mg/dL (ref 8.9–10.3)
Chloride: 100 mmol/L (ref 98–111)
Creatinine, Ser: 0.62 mg/dL (ref 0.61–1.24)
GFR, Estimated: >60 mL/min (ref >60)
Glucose, Bld: 308 mg/dL — ABNORMAL HIGH (ref 70–99)
Potassium: 3.9 mmol/L (ref 3.5–5.1)
Sodium: 135 mmol/L (ref 135–145)
Total Bilirubin: 0.3 mg/dL (ref 0.3–1.2)
Total Protein: 8 g/dL (ref 6.5–8.1)

## 2021-08-20 LAB — URINALYSIS, MICROSCOPIC (REFLEX)

## 2021-08-20 LAB — URINALYSIS, ROUTINE W REFLEX MICROSCOPIC
Bilirubin Urine: NEGATIVE
Glucose, UA: >=500 mg/dL — AB
Hgb urine dipstick: NEGATIVE
Ketones, ur: NEGATIVE mg/dL
Leukocytes,Ua: NEGATIVE
Nitrite: NEGATIVE
Protein, ur: NEGATIVE mg/dL
Specific Gravity, Urine: 1.02 (ref 1.005–1.030)
pH: 7 (ref 5.0–8.0)

## 2021-08-20 LAB — LIPASE, BLOOD: Lipase: 46 U/L (ref 11–51)

## 2021-08-20 MED ORDER — FENTANYL CITRATE PF 50 MCG/ML IJ SOSY
100.0000 ug | PREFILLED_SYRINGE | Freq: Once | INTRAMUSCULAR | Status: AC
  Administered 2021-08-20: 100 ug via INTRAVENOUS
  Filled 2021-08-20: qty 2

## 2021-08-20 MED ORDER — SUCRALFATE 1 GM/10ML PO SUSP
1.0000 g | Freq: Once | ORAL | Status: AC
  Administered 2021-08-20: 1 g via ORAL
  Filled 2021-08-20: qty 10

## 2021-08-20 MED ORDER — PANTOPRAZOLE SODIUM 40 MG IV SOLR
40.0000 mg | Freq: Once | INTRAVENOUS | Status: AC
  Administered 2021-08-20: 40 mg via INTRAVENOUS
  Filled 2021-08-20: qty 40

## 2021-08-20 MED ORDER — ONDANSETRON HCL 4 MG/2ML IJ SOLN
4.0000 mg | Freq: Once | INTRAMUSCULAR | Status: AC
  Administered 2021-08-20: 4 mg via INTRAVENOUS
  Filled 2021-08-20: qty 2

## 2021-08-20 NOTE — ED Triage Notes (Signed)
Abdominal pain with bloating for an hour. No vomiting or diarrhea.

## 2021-08-20 NOTE — ED Provider Notes (Signed)
[facility] Provider Note: Dillon Dell, MD, FACEP  CSN: [id number] MRN: [id number] ARRIVAL: 08/20/21 at 2208 ROOM: [facility]10/[facility]10   CHIEF COMPLAINT  Abdominal Pain   HISTORY OF PRESENT ILLNESS  08/20/21 11:16 PM Dillon Knight is a 45 y.o. male who developed epigastric pain about 9 PM.  He describes his pain as feeling like a bloating sensation or a sensation of being punched in the stomach.  He rates the pain as a 9 out of 10 and the pain is worse with bending over and finds it difficult to stand back up when he bends over.  He has no associated nausea, vomiting or diarrhea.  He had a similar pain about 2 months ago but it was not as severe.   Past Medical History:  Diagnosis Date   Diabetes mellitus without complication (HCC)     History reviewed. No pertinent surgical history.  No family history on file.  Social History   Tobacco Use   Smoking status: Never   Smokeless tobacco: Never  Vaping Use   Vaping Use: Never used  Substance Use Topics   Alcohol use: Yes    Comment: occ   Drug use: Never    Prior to Admission medications   Medication Sig Start Date End Date Taking? Authorizing Provider  insulin regular (NOVOLIN R) 100 units/mL injection Inject into the skin 3 (three) times daily before meals.   Yes [provider]  metFORMIN (GLUCOPHAGE) 1000 MG tablet Take 1,000 mg by mouth 2 (two) times daily with a meal.   Yes [provider]  omeprazole (PRILOSEC) 40 MG capsule Take 1 capsule daily at least 30 minutes prior to first dose of Carafate. 08/21/21  Yes Korby Ratay, MD  sucralfate (CARAFATE) 1 g tablet Take 1 tablet (1 g total) by mouth 4 (four) times daily -  with meals and at bedtime. 08/21/21  Yes Mykaila Blunck, MD    Allergies Patient has no known allergies.   REVIEW OF SYSTEMS  Negative except as noted here or in the History of Present Illness.   PHYSICAL EXAMINATION  Initial Vital Signs Blood pressure (!)  164/100, pulse 78, temperature 98.9 F (37.2 C), temperature source Oral, resp. rate 18, height 5\' 10"  (1.778 m), weight 99.8 kg, SpO2 98 %.  Examination General: Well-developed, well-nourished male in no acute distress; appearance consistent with age of record HENT: normocephalic; atraumatic Eyes: pupils equal, round and reactive to light; extraocular muscles intact Neck: supple Heart: regular rate and rhythm Lungs: clear to auscultation bilaterally Abdomen: soft; nondistended; upper abdominal tenderness most prominent in the epigastrium and left upper quadrant; no masses or hepatosplenomegaly; bowel sounds present Extremities: No deformity; full range of motion; pulses normal Neurologic: Awake, alert and oriented; motor function intact in all extremities and symmetric; no facial droop Skin: Warm and dry Psychiatric: Normal mood and affect   RESULTS  Summary of this visit's results, reviewed and interpreted by myself:   EKG Interpretation  Date/Time:    Ventricular Rate:    PR Interval:    QRS Duration:   QT Interval:    QTC Calculation:   R Axis:     Text Interpretation:         Laboratory Studies: Results for orders placed or performed during the hospital encounter of 08/20/21 (from the past 24 hour(s))  Lipase, blood     Status: None   Collection Time: 08/20/21 10:26 PM  Result Value Ref Range   Lipase 46 11 - 51 U/L  Comprehensive metabolic panel     Status: Abnormal   Collection Time: 08/20/21 10:26 PM  Result Value Ref Range   Sodium 135 135 - 145 mmol/L   Potassium 3.9 3.5 - 5.1 mmol/L   Chloride 100 98 - 111 mmol/L   CO2 28 22 - 32 mmol/L   Glucose, Bld 308 (H) 70 - 99 mg/dL   BUN 13 6 - 20 mg/dL   Creatinine, Ser 5.03 0.61 - 1.24 mg/dL   Calcium 9.0 8.9 - 54.6 mg/dL   Total Protein 8.0 6.5 - 8.1 g/dL   Albumin 4.3 3.5 - 5.0 g/dL   AST 41 15 - 41 U/L   ALT 53 (H) 0 - 44 U/L   Alkaline Phosphatase 64 38 - 126 U/L   Total Bilirubin 0.3 0.3 - 1.2 mg/dL    GFR, Estimated >56 >81 mL/min   Anion gap 7 5 - 15  CBC     Status: None   Collection Time: 08/20/21 10:26 PM  Result Value Ref Range   WBC 5.0 4.0 - 10.5 K/uL   RBC 4.45 4.22 - 5.81 MIL/uL   Hemoglobin 14.3 13.0 - 17.0 g/dL   HCT 27.5 17.0 - 01.7 %   MCV 89.7 80.0 - 100.0 fL   MCH 32.1 26.0 - 34.0 pg   MCHC 35.8 30.0 - 36.0 g/dL   RDW 49.4 49.6 - 75.9 %   Platelets 247 150 - 400 K/uL   nRBC 0.0 0.0 - 0.2 %  Urinalysis, Routine w reflex microscopic Urine, Clean Catch     Status: Abnormal   Collection Time: 08/20/21 10:27 PM  Result Value Ref Range   Color, Urine YELLOW YELLOW   APPearance CLEAR CLEAR   Specific Gravity, Urine 1.020 1.005 - 1.030   pH 7.0 5.0 - 8.0   Glucose, UA >=500 (A) NEGATIVE mg/dL   Hgb urine dipstick NEGATIVE NEGATIVE   Bilirubin Urine NEGATIVE NEGATIVE   Ketones, ur NEGATIVE NEGATIVE mg/dL   Protein, ur NEGATIVE NEGATIVE mg/dL   Nitrite NEGATIVE NEGATIVE   Leukocytes,Ua NEGATIVE NEGATIVE  Urinalysis, Microscopic (reflex)     Status: Abnormal   Collection Time: 08/20/21 10:27 PM  Result Value Ref Range   RBC / HPF 0-5 0 - 5 RBC/hpf   WBC, UA 0-5 0 - 5 WBC/hpf   Bacteria, UA RARE (A) NONE SEEN   Squamous Epithelial / LPF 0-5 0 - 5   Imaging Studies: CT ABDOMEN PELVIS W CONTRAST  Result Date: 08/21/2021 CLINICAL DATA:  Left upper quadrant pain EXAM: CT ABDOMEN AND PELVIS WITH CONTRAST TECHNIQUE: Multidetector CT imaging of the abdomen and pelvis was performed using the standard protocol following bolus administration of intravenous contrast. CONTRAST:  30mL OMNIPAQUE IOHEXOL 350 MG/ML SOLN COMPARISON:  None. FINDINGS: Lower chest: No acute abnormality. Hepatobiliary: Mild fatty infiltration of the liver is noted. The gallbladder is within normal limits. Pancreas: Unremarkable. No pancreatic ductal dilatation or surrounding inflammatory changes. Spleen: Normal in size without focal abnormality. Adrenals/Urinary Tract: Adrenal glands are within normal  limits. Kidneys are well visualize within normal enhancement pattern. No renal calculi are seen. Normal excretion is noted on delayed images. The bladder is well distended. Stomach/Bowel: The appendix is well visualized within normal limits. No obstructive or inflammatory changes of the colon are seen. Small bowel and stomach appear within normal limits. Vascular/Lymphatic: Aortic atherosclerosis. No enlarged abdominal or pelvic lymph nodes. Reproductive: Prostate is unremarkable. Other: No abdominal wall hernia or abnormality. No abdominopelvic ascites. Musculoskeletal: Mild degenerative changes  of lumbar spine are noted. IMPRESSION: Fatty liver. No acute abnormality noted. Electronically Signed   By: Alcide Clever M.D.   On: 08/21/2021 00:23    ED COURSE and MDM  Nursing notes, initial and subsequent vitals signs, including pulse oximetry, reviewed and interpreted by myself.  Vitals:   08/20/21 2219 08/20/21 2225  BP:  (!) 164/100  Pulse:  78  Resp:  18  Temp:  98.9 F (37.2 C)  TempSrc:  Oral  SpO2:  98%  Weight: 99.8 kg   Height: 5\' 10"  (1.778 m)    Medications  ondansetron (ZOFRAN) injection 4 mg (4 mg Intravenous Given 08/20/21 2346)  fentaNYL (SUBLIMAZE) injection 100 mcg (100 mcg Intravenous Given 08/20/21 2349)  pantoprazole (PROTONIX) injection 40 mg (40 mg Intravenous Given 08/20/21 2346)  sucralfate (CARAFATE) 1 GM/10ML suspension 1 g (1 g Oral Given 08/20/21 2349)  iohexol (OMNIPAQUE) 350 MG/ML injection 100 mL (100 mLs Intravenous Contrast Given 08/21/21 0017)   Patient CT and laboratory studies are reassuring.  I suspect this represents gastritis or early peptic ulcer disease.  We will place him on a PPI and Carafate.   PROCEDURES  Procedures   ED DIAGNOSES     ICD-10-CM   1. Left upper quadrant abdominal pain  R10.12          Kayleena Eke, 10/21/21, MD 08/21/21 0030

## 2021-08-21 MED ORDER — IOHEXOL 350 MG/ML SOLN
100.0000 mL | Freq: Once | INTRAVENOUS | Status: AC | PRN
  Administered 2021-08-21: 100 mL via INTRAVENOUS

## 2021-08-21 MED ORDER — SUCRALFATE 1 G PO TABS: 1.0000 g | ORAL_TABLET | Freq: Three times a day (TID) | ORAL | 0 refills | Status: AC

## 2021-08-21 MED ORDER — OMEPRAZOLE 40 MG PO CPDR: DELAYED_RELEASE_CAPSULE | ORAL | 0 refills | Status: AC

## 2022-10-04 IMAGING — CT CT ABD-PELV W/ CM
2 of 5 series · 16 of 46 positions shown, 18 images · IV contrast (Omnipaque)
Comparison: None.

CLINICAL DATA: Left upper quadrant pain

EXAM:
CT ABDOMEN AND PELVIS WITH CONTRAST
TECHNIQUE: Multidetector CT imaging of the abdomen and pelvis was performed
using the standard protocol following bolus administration of
intravenous contrast.
CONTRAST:  80mL OMNIPAQUE IOHEXOL 350 MG/ML SOLN

[Series 2: axial st · axial · 0.98mm/px · z∈[+424,+884]mm · 13 of 104 slices shown, 15 images]
[im 6/104  soft-tissue]
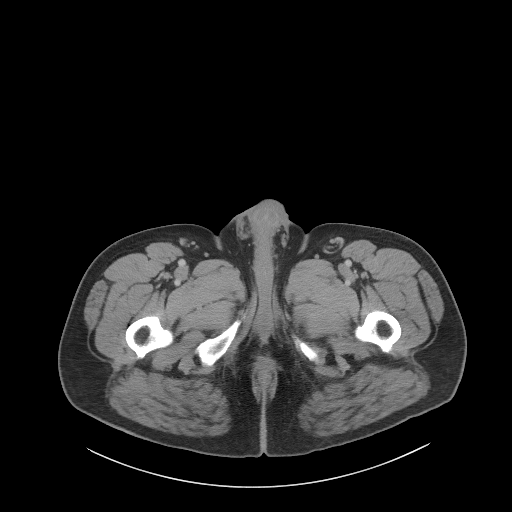
[im 6/104  bone]
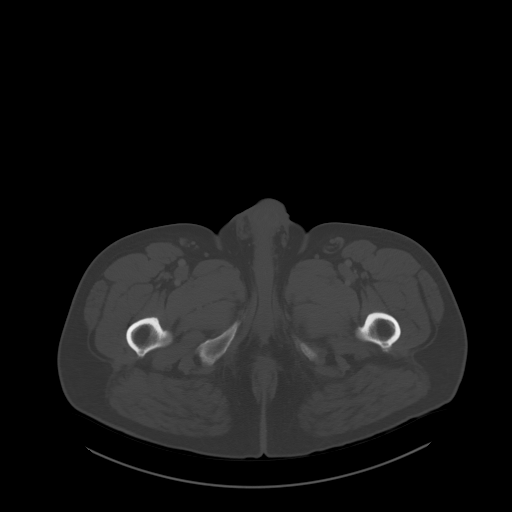
[im 17/104  soft-tissue]
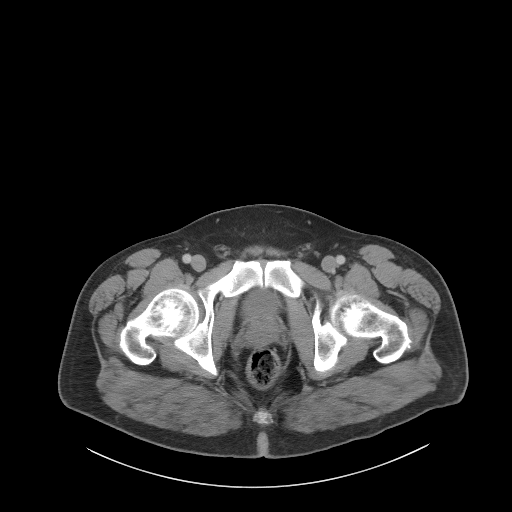
[im 22/104  soft-tissue]
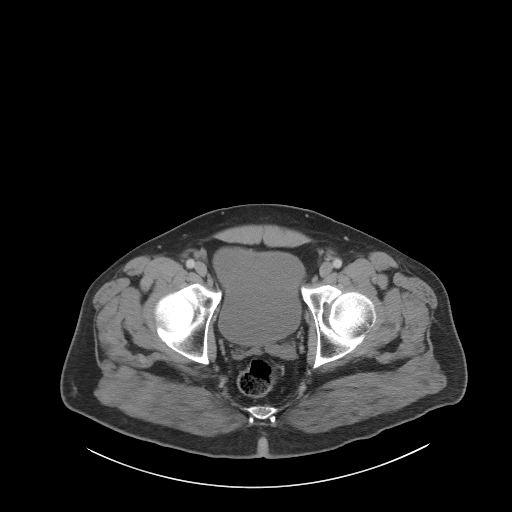
[im 28/104  soft-tissue]
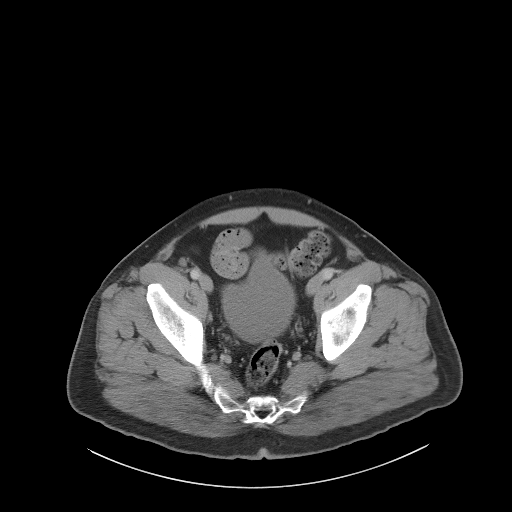
[im 38/104  soft-tissue]
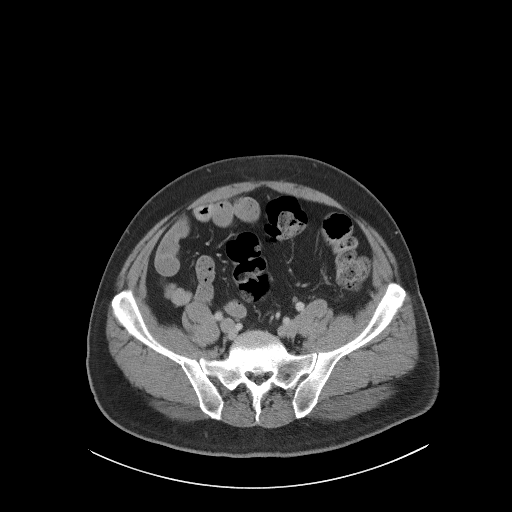
[im 44/104  soft-tissue]
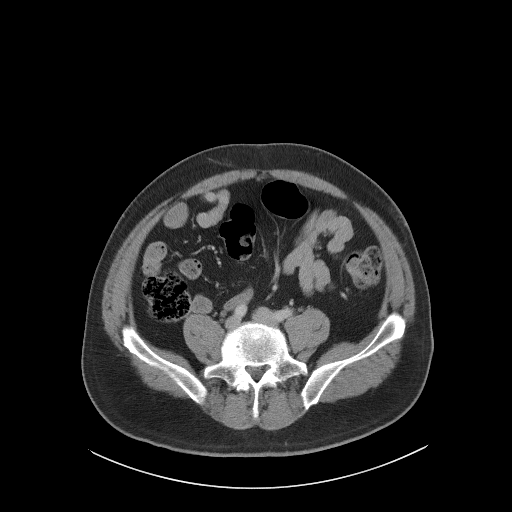
[im 55/104  soft-tissue]
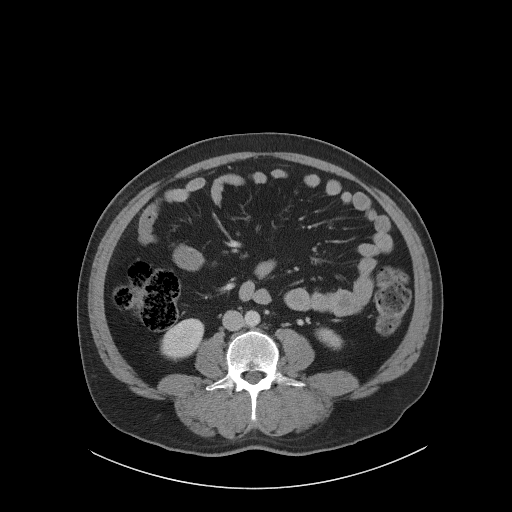
[im 60/104  soft-tissue]
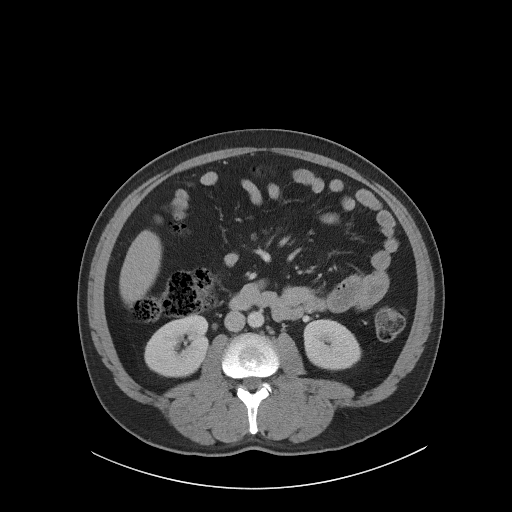
[im 66/104  soft-tissue]
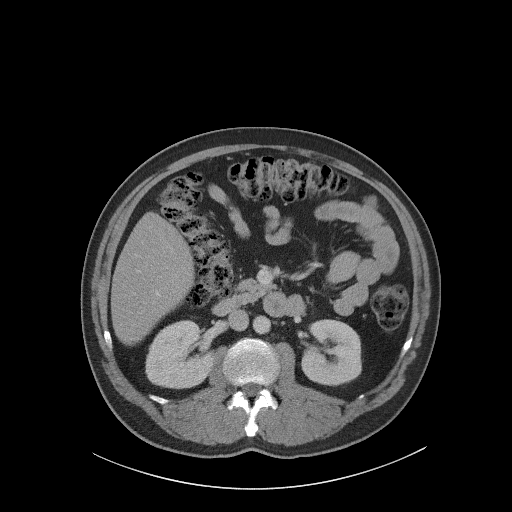
[im 66/104  bone]
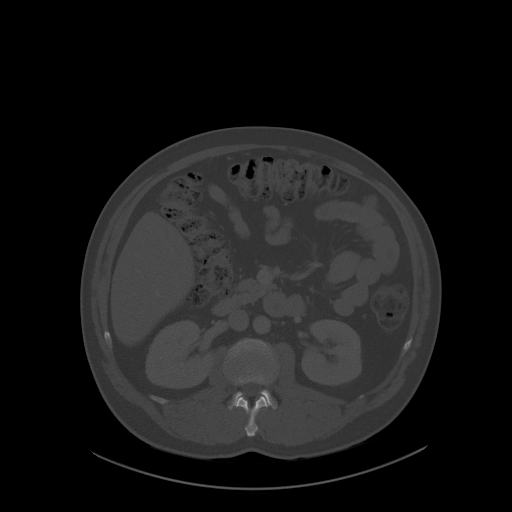
[im 76/104  soft-tissue]
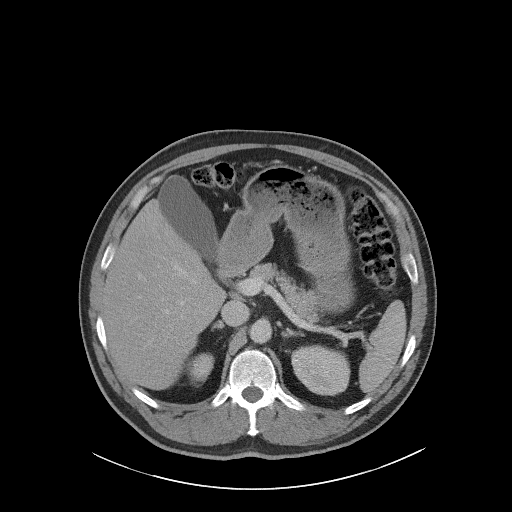
[im 82/104  soft-tissue]
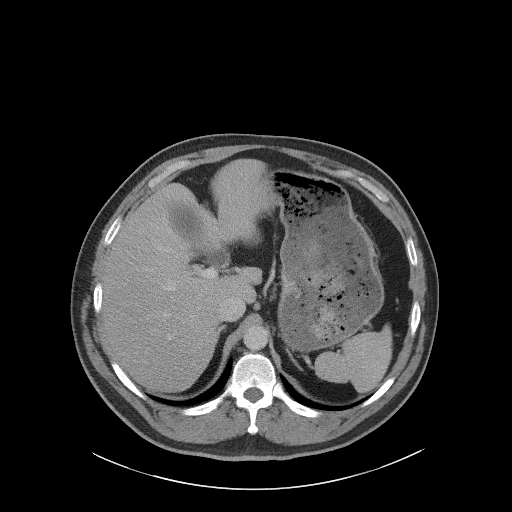
[im 87/104  soft-tissue]
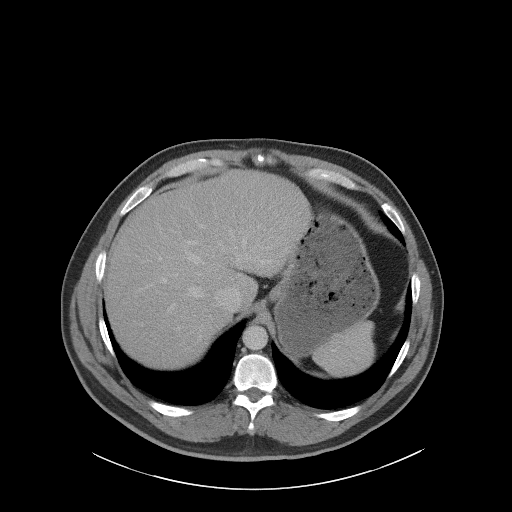
[im 98/104  soft-tissue]
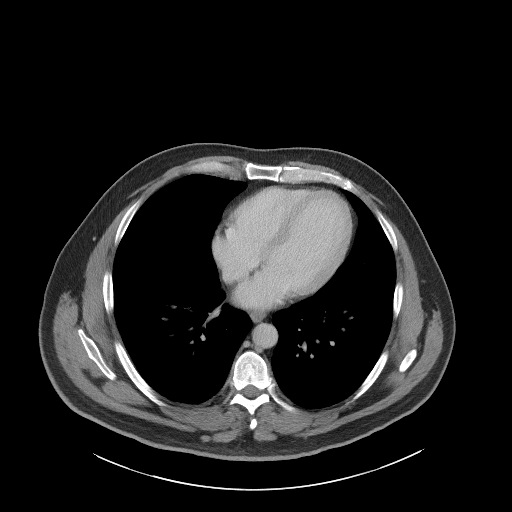

[Series 5: coronal st · coronal · 0.77mm/px · 3 of 115 slices shown]
[im 39/115  soft-tissue]
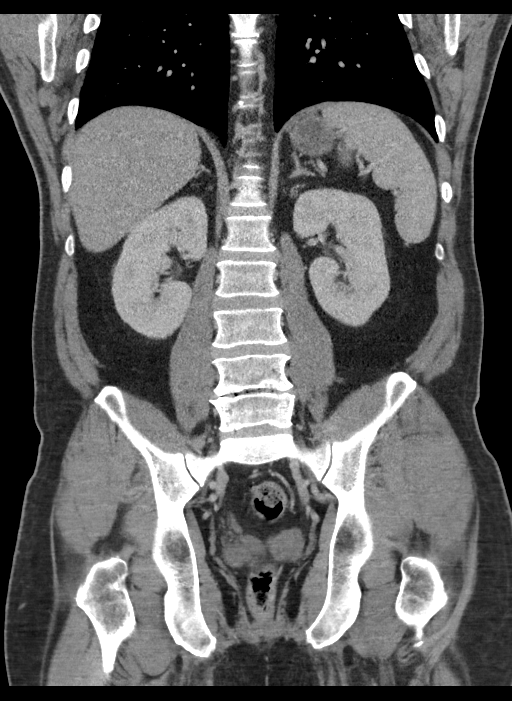
[im 51/115  soft-tissue]
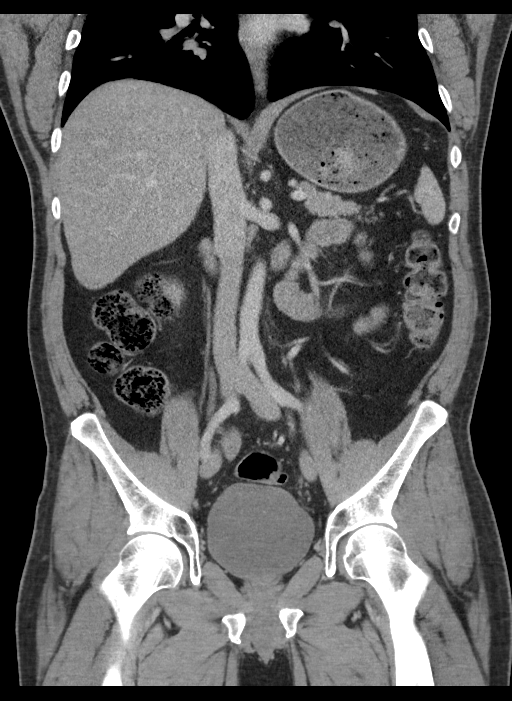
[im 64/115  soft-tissue]
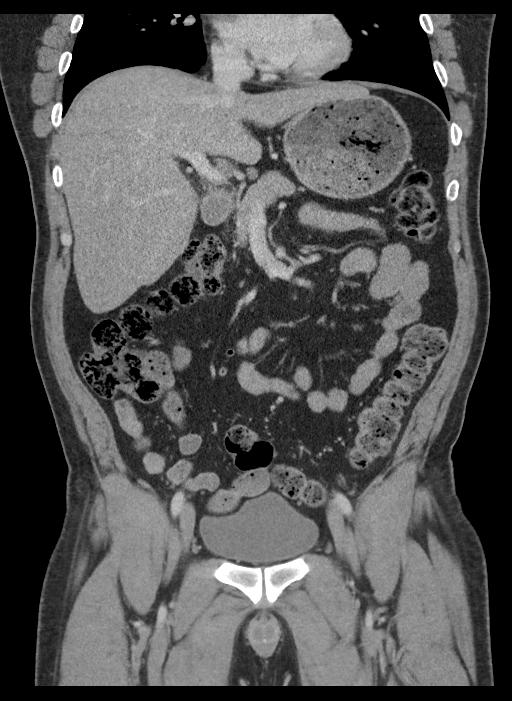

[16 of 46 positions shown; findings below may reference images not displayed]

FINDINGS: Lower chest: No acute abnormality.

Hepatobiliary: Mild fatty infiltration of the liver is noted. The
gallbladder is within normal limits.

Pancreas: Unremarkable. No pancreatic ductal dilatation or
surrounding inflammatory changes.

Spleen: Normal in size without focal abnormality.

Adrenals/Urinary Tract: Adrenal glands are within normal limits.
Kidneys are well visualize within normal enhancement pattern. No
renal calculi are seen. Normal excretion is noted on delayed images.
The bladder is well distended.

Stomach/Bowel: The appendix is well visualized within normal limits.
No obstructive or inflammatory changes of the colon are seen. Small
bowel and stomach appear within normal limits.

Vascular/Lymphatic: Aortic atherosclerosis. No enlarged abdominal or
pelvic lymph nodes.

Reproductive: Prostate is unremarkable.

Other: No abdominal wall hernia or abnormality. No abdominopelvic
ascites.

Musculoskeletal: Mild degenerative changes of lumbar spine are
noted.
IMPRESSION: Fatty liver.

No acute abnormality noted.

## 2023-01-01 ENCOUNTER — Emergency Department (HOSPITAL_BASED_OUTPATIENT_CLINIC_OR_DEPARTMENT_OTHER)
Admission: EM | Admit: 2023-01-01 | Discharge: 2023-01-02 | Disposition: A | Discharge status: Home / Self Care | Payer: Self-pay | Attending: Emergency Medicine | Admitting: Emergency Medicine

## 2023-01-01 ENCOUNTER — Emergency Department (HOSPITAL_BASED_OUTPATIENT_CLINIC_OR_DEPARTMENT_OTHER): Payer: Self-pay

## 2023-01-01 ENCOUNTER — Other Ambulatory Visit: Payer: Self-pay

## 2023-01-01 DIAGNOSIS — R519 Headache, unspecified: Secondary | ICD-10-CM | POA: Insufficient documentation

## 2023-01-01 DIAGNOSIS — Z20822 Contact with and (suspected) exposure to covid-19: Secondary | ICD-10-CM | POA: Insufficient documentation

## 2023-01-01 LAB — RESP PANEL BY RT-PCR (RSV, FLU A&B, COVID)  RVPGX2
Influenza A by PCR: NEGATIVE
Influenza B by PCR: NEGATIVE
Resp Syncytial Virus by PCR: NEGATIVE
SARS Coronavirus 2 by RT PCR: NEGATIVE

## 2023-01-01 MED ORDER — ACETAMINOPHEN 500 MG PO TABS
1000.0000 mg | ORAL_TABLET | Freq: Once | ORAL | Status: AC
  Administered 2023-01-01: 1000 mg via ORAL
  Filled 2023-01-01: qty 2

## 2023-01-01 MED ORDER — DIPHENHYDRAMINE HCL 25 MG PO CAPS
25.0000 mg | ORAL_CAPSULE | Freq: Once | ORAL | Status: AC
  Administered 2023-01-01: 25 mg via ORAL
  Filled 2023-01-01: qty 1

## 2023-01-01 MED ORDER — METOCLOPRAMIDE HCL 10 MG PO TABS
10.0000 mg | ORAL_TABLET | Freq: Once | ORAL | Status: AC
  Administered 2023-01-01: 10 mg via ORAL
  Filled 2023-01-01: qty 1

## 2023-01-01 NOTE — ED Triage Notes (Signed)
Pt reports left side headache with light sensitivity in left eye since yesterday at 4pm. Pt denies weakness. Pt reports generalized body aches as well.

## 2023-01-02 MED ORDER — KETOROLAC TROMETHAMINE 60 MG/2ML IM SOLN
30.0000 mg | Freq: Once | INTRAMUSCULAR | Status: AC
  Administered 2023-01-02: 30 mg via INTRAMUSCULAR
  Filled 2023-01-02: qty 2

## 2023-01-02 NOTE — ED Provider Notes (Signed)
[facility] Provider Note   CSN: [id number] Arrival date & time: 01/01/23  2007     History  Chief Complaint  Patient presents with   Headache    Dillon Knight is a 47 y.o. male.  The history is provided by the patient.  Headache Pain location:  L parietal Quality:  Dull Radiates to:  Does not radiate Onset quality:  Sudden Duration:  2 days Timing:  Constant Progression:  Worsening Context: not straining   Relieved by:  Nothing Worsened by:  Nothing Ineffective treatments:  None tried Associated symptoms: myalgias   Associated symptoms: no congestion, no cough, no eye pain, no fever, no neck pain, no neck stiffness, no URI, no vomiting and no weakness   Risk factors: no anger        Home Medications Prior to Admission medications   Medication Sig Start Date End Date Taking? Authorizing Provider  insulin regular (NOVOLIN R) 100 units/mL injection Inject into the skin 3 (three) times daily before meals.    [provider]  metFORMIN (GLUCOPHAGE) 1000 MG tablet Take 1,000 mg by mouth 2 (two) times daily with a meal.    [provider]  omeprazole (PRILOSEC) 40 MG capsule Take 1 capsule daily at least 30 minutes prior to first dose of Carafate. 08/21/21   Molpus, John, MD  sucralfate (CARAFATE) 1 g tablet Take 1 tablet (1 g total) by mouth 4 (four) times daily -  with meals and at bedtime. 08/21/21   Molpus, Jenny Reichmann, MD      Allergies    Patient has no known allergies.    Review of Systems   Review of Systems  Constitutional:  Negative for fever.  HENT:  Negative for congestion and drooling.   Eyes:  Negative for pain, redness and visual disturbance.  Respiratory:  Negative for cough.   Gastrointestinal:  Negative for vomiting.  Musculoskeletal:  Positive for myalgias. Negative for neck pain and neck stiffness.  Neurological:  Positive for headaches. Negative for weakness.  All other systems  reviewed and are negative.   Physical Exam Updated Vital Signs BP (!) 150/83   Pulse 61   Temp 98.1 F (36.7 C) (Oral)   Resp 18   Ht 5' 9"$  (1.753 m)   Wt 99.8 kg   SpO2 96%   BMI 32.49 kg/m  Physical Exam Vitals and nursing note reviewed.  Constitutional:      General: He is not in acute distress.    Appearance: He is well-developed. He is not diaphoretic.  HENT:     Head: Normocephalic and atraumatic.     Nose: Nose normal.     Mouth/Throat:     Mouth: Mucous membranes are moist.     Pharynx: Oropharynx is clear.  Eyes:     Extraocular Movements: Extraocular movements intact.     Conjunctiva/sclera: Conjunctivae normal.     Pupils: Pupils are equal, round, and reactive to light.     Comments: Disk margins sharp no proptosis intact cognition   Cardiovascular:     Rate and Rhythm: Normal rate and regular rhythm.  Pulmonary:     Effort: Pulmonary effort is normal.     Breath sounds: Normal breath sounds. No wheezing or rales.  Abdominal:     General: Bowel sounds are normal.     Palpations: Abdomen is soft.     Tenderness: There is no abdominal tenderness. There is no guarding or rebound.  Musculoskeletal:  General: Normal range of motion.     Cervical back: Normal range of motion and neck supple.  Skin:    General: Skin is warm and dry.     Capillary Refill: Capillary refill takes less than 2 seconds.  Neurological:     General: No focal deficit present.     Mental Status: He is alert and oriented to person, place, and time.     Deep Tendon Reflexes: Reflexes normal.  Psychiatric:        Mood and Affect: Mood normal.        Behavior: Behavior normal.     ED Results / Procedures / Treatments   Labs (all labs ordered are listed, but only abnormal results are displayed) Labs Reviewed  RESP PANEL BY RT-PCR (RSV, FLU A&B, COVID)  [id number]    EKG None  Radiology CT Head Wo Contrast  Result Date: 01/02/2023 CLINICAL DATA:  Syncope/presyncope,  cerebrovascular cause suspected. Left headache EXAM: CT HEAD WITHOUT CONTRAST TECHNIQUE: Contiguous axial images were obtained from the base of the skull through the vertex without intravenous contrast. RADIATION DOSE REDUCTION: This exam was performed according to the departmental dose-optimization program which includes automated exposure control, adjustment of the mA and/or kV according to patient size and/or use of iterative reconstruction technique. COMPARISON:  None Available. FINDINGS: Brain: Normal anatomic configuration. No abnormal intra or extra-axial mass lesion or fluid collection. No abnormal mass effect or midline shift. No evidence of acute intracranial hemorrhage or infarct. Ventricular size is normal. Cerebellum unremarkable. Vascular: Unremarkable Skull: Intact Sinuses/Orbits: Paranasal sinuses are clear. Orbits are unremarkable. Other: Mastoid air cells and middle ear cavities are clear. IMPRESSION: 1. No acute intracranial abnormality. Electronically Signed   By: Fidela Salisbury M.D.   On: 01/02/2023 00:12    Procedures Procedures    Medications Ordered in ED Medications  diphenhydrAMINE (BENADRYL) capsule 25 mg (25 mg Oral Given 01/01/23 2336)  metoCLOPramide (REGLAN) tablet 10 mg (10 mg Oral Given 01/01/23 2336)  acetaminophen (TYLENOL) tablet 1,000 mg (1,000 mg Oral Given 01/01/23 2336)  ketorolac (TORADOL) injection 30 mg (30 mg Intramuscular Given 01/02/23 0035)    ED Course/ Medical Decision Making/ A&P                             Medical Decision Making Headache for 2 days left sided with body aches   Amount and/or Complexity of Data Reviewed Independent Historian: spouse    Details: See above  External Data Reviewed: notes.    Details: Previous notes reviewed  Labs: ordered.    Details: Negative covid and flu  Radiology: ordered and independent interpretation performed.    Details: Negative head CT by me  Risk OTC drugs. Prescription drug management. Risk  Details: Resting comfortably post medication.  I doubt meningitis without fever, neck pain or rash.  No ICH on CT.  I do not believe this is a cavernous sinus thrombosis.  Patient is EOMI, without proptosis, intact disk margins and intact cognition.  Stable for discharge.  Strict return.      Final Clinical Impression(s) / ED Diagnoses Final diagnoses:  None   Return for intractable cough, coughing up blood, fevers > 100.4 unrelieved by medication, shortness of breath, intractable vomiting, chest pain, shortness of breath, weakness, numbness, changes in speech, facial asymmetry, abdominal pain, passing out, Inability to tolerate liquids or food, cough, altered mental status or any concerns. No signs of systemic illness or infection. The  patient is nontoxic-appearing on exam and vital signs are within normal limits.  I have reviewed the triage vital signs and the nursing notes. Pertinent labs & imaging results that were available during my care of the patient were reviewed by me and considered in my medical decision making (see chart for details). After history, exam, and medical workup I feel the patient has been appropriately medically screened and is safe for discharge home. Pertinent diagnoses were discussed with the patient. Patient was given return precautions.   Rx / DC Orders ED Discharge Orders     None         Rawley Harju, MD 01/02/23 [id number]

## 2023-04-19 ENCOUNTER — Other Ambulatory Visit (HOSPITAL_BASED_OUTPATIENT_CLINIC_OR_DEPARTMENT_OTHER): Payer: Self-pay

## 2023-04-19 ENCOUNTER — Ambulatory Visit: Payer: Self-pay | Admitting: Urology

## 2023-04-19 ENCOUNTER — Encounter: Payer: Self-pay | Admitting: Urology

## 2023-04-19 VITALS — BP 150/89 | HR 66 | Ht 68.0 in | Wt 210.0 lb

## 2023-04-19 DIAGNOSIS — N486 Induration penis plastica: Secondary | ICD-10-CM

## 2023-04-19 DIAGNOSIS — N529 Male erectile dysfunction, unspecified: Secondary | ICD-10-CM

## 2023-04-19 MED ORDER — TADALAFIL 20 MG PO TABS
20.0000 mg | ORAL_TABLET | Freq: Every day | ORAL | 5 refills | Status: AC | PRN
  Filled 2023-04-19: qty 10, 10d supply, fill #0
  Filled 2023-09-06: qty 10, 10d supply, fill #1
  Filled 2024-02-22: qty 10, 10d supply, fill #2

## 2023-04-19 NOTE — Progress Notes (Signed)
   Assessment: 1. Erectile dysfunction of organic origin   2. Peyronie's disease      Plan: Today I had a long discussion with the patient and his wife regarding his erectile dysfunction as well as Peyronie's disease.  I told him at this time given his tenderness that he still has active Peyronie's and we would not address his curvature until that has stabilized.  We also discussed treatment options for his ED using a goal oriented approach. Following our discussion he would like to try new medical therapy. Rx: Tadalafil 20 mg as needed Ashby Dawes of medication including proper utilization as well as potential adverse events and side effects reviewed.  Patient return in 6 months for recheck  Chief Complaint: ED  History of Present Illness:  Dillon Knight is a 47 y.o. male who is seen in consultation from Patient, No Pcp Per for evaluation of ED and penile curvature. Patient has insulin-dependent diabetes and has had progressive ED over the last several years.  He has not had any medical treatment.  He is unable to obtain much more than a half erection with sexual stimulation.  Since November 2023 he has also noted progressive penile curvature.  He has dorsal curvature from the mid shaft to the glans.  It is also quite tender to palpation and with attempts at an erection.   Past Medical History:  Past Medical History:  Diagnosis Date   Diabetes mellitus without complication (HCC)     Past Surgical History:  History reviewed. No pertinent surgical history.  Allergies:  No Known Allergies  Family History:  History reviewed. No pertinent family history.  Social History:  Social History   Tobacco Use   Smoking status: Never   Smokeless tobacco: Never  Vaping Use   Vaping Use: Never used  Substance Use Topics   Alcohol use: Yes    Comment: occ   Drug use: Never    Review of symptoms:  Constitutional:  Negative for unexplained weight loss, night sweats, fever,  chills ENT:  Negative for nose bleeds, sinus pain, painful swallowing CV:  Negative for chest pain, shortness of breath, exercise intolerance, palpitations, loss of consciousness Resp:  Negative for cough, wheezing, shortness of breath GI:  Negative for nausea, vomiting, diarrhea, bloody stools GU:  Positives noted in HPI; otherwise negative for gross hematuria, dysuria, urinary incontinence Neuro:  Negative for seizures, poor balance, limb weakness, slurred speech Psych:  Negative for lack of energy, depression, anxiety Endocrine:  Negative for polydipsia, polyuria, symptoms of hypoglycemia (dizziness, hunger, sweating) Hematologic:  Negative for anemia, purpura, petechia, prolonged or excessive bleeding, use of anticoagulants  Allergic:  Negative for difficulty breathing or choking as a result of exposure to anything; no shellfish allergy; no allergic response (rash/itch) to materials, foods  Physical exam: BP (!) 150/89   Pulse 66   Ht 5\' 8"  (1.727 m)   Wt 210 lb (95.3 kg)   BMI 31.93 kg/m  GENERAL APPEARANCE:  Well appearing, well developed, well nourished, NAD  GU: Uncircumcised phallus.  Prominent dorsal plaque along much of the length of the penis.  Most prominent mid shaft.  It is tender to palpation.

## 2023-09-06 ENCOUNTER — Other Ambulatory Visit (HOSPITAL_BASED_OUTPATIENT_CLINIC_OR_DEPARTMENT_OTHER): Payer: Self-pay

## 2023-10-24 ENCOUNTER — Ambulatory Visit: Payer: Self-pay | Admitting: Urology

## 2024-02-22 ENCOUNTER — Other Ambulatory Visit (HOSPITAL_BASED_OUTPATIENT_CLINIC_OR_DEPARTMENT_OTHER): Payer: Self-pay

## 2024-08-20 ENCOUNTER — Other Ambulatory Visit: Payer: Self-pay | Admitting: Urology

## 2024-10-15 ENCOUNTER — Other Ambulatory Visit (HOSPITAL_BASED_OUTPATIENT_CLINIC_OR_DEPARTMENT_OTHER): Payer: Self-pay

## 2024-10-15 ENCOUNTER — Encounter: Payer: Self-pay | Admitting: Emergency Medicine

## 2024-10-15 ENCOUNTER — Ambulatory Visit: Admission: EM | Admit: 2024-10-15 | Discharge: 2024-10-15 | Disposition: A | Discharge status: Home / Self Care

## 2024-10-15 DIAGNOSIS — J069 Acute upper respiratory infection, unspecified: Secondary | ICD-10-CM | POA: Diagnosis not present

## 2024-10-15 LAB — POC COVID19/FLU A&B COMBO
Covid Antigen, POC: NEGATIVE
Influenza A Antigen, POC: NEGATIVE
Influenza B Antigen, POC: NEGATIVE

## 2024-10-15 LAB — POCT RAPID STREP A (OFFICE): Rapid Strep A Screen: NEGATIVE

## 2024-10-15 NOTE — ED Triage Notes (Signed)
 Spanish Interpretor Geni - Pt's family member. Pt verbalizes approval for family member interpretation.  Pt presents c/o otalgia and sore throat x 2 days. Pt states,  My ears and throat have been hurting since yesterday. I can't sleep because my body is hurting. I have a runny nose and a headache too.  Pt denies emesis and diarrhea.

## 2024-10-15 NOTE — Discharge Instructions (Signed)

## 2024-10-15 NOTE — ED Provider Notes (Signed)
 [facility]    CSN: [id number] Arrival date & time: 10/15/24  1604      History   Chief Complaint Chief Complaint  Patient presents with   Otalgia   Sore Throat   Headache   Generalized Body Aches    HPI Dillon Knight is a 48 y.o. male.   Pt presents today due to 2 days of throat pain, cough, nasal congestion, headache, body aches, and bilateral ear pain. Pt denies fever or chills. Pt states that he he took Theraflu last night with some temporary relief of symptoms.   The history is provided by the patient.  Otalgia Associated symptoms: headaches   Sore Throat Associated symptoms include headaches.  Headache Associated symptoms: ear pain     Past Medical History:  Diagnosis Date   Diabetes mellitus without complication (HCC)     There are no active problems to display for this patient.   History reviewed. No pertinent surgical history.     Home Medications    Prior to Admission medications   Medication Sig Start Date End Date Taking? Authorizing Provider  atorvastatin (LIPITOR) 20 MG tablet Take 20 mg by mouth daily. 09/02/24  Yes [provider]  JARDIANCE 10 MG TABS tablet Take 10 mg by mouth daily. 09/02/24  Yes [provider]  losartan (COZAAR) 25 MG tablet Take 25 mg by mouth daily. 09/02/24  Yes [provider]  metFORMIN (GLUCOPHAGE) 1000 MG tablet Take 1,000 mg by mouth 2 (two) times daily with a meal.   Yes [provider]  fexofenadine-pseudoephedrine (ALLEGRA-D 24) 180-240 MG 24 hr tablet Take 1 tablet by mouth daily.    [provider]  glimepiride (AMARYL) 2 MG tablet Take 2 mg by mouth.    [provider]  insulin regular (NOVOLIN R) 100 units/mL injection Inject into the skin 3 (three) times daily before meals.    [provider]  Omega-3 Fatty Acids (FISH OIL) 1000 MG CAPS Take 1 capsule by mouth daily.    [provider]  omeprazole  (PRILOSEC) 40  MG capsule Take 1 capsule daily at least 30 minutes prior to first dose of Carafate . 08/21/21   Molpus, Norleen, MD  sucralfate  (CARAFATE ) 1 g tablet Take 1 tablet (1 g total) by mouth 4 (four) times daily -  with meals and at bedtime. 08/21/21   Molpus, John, MD  tadalafil  (CIALIS ) 20 MG tablet Take 1 tablet (20 mg total) by mouth daily as needed for erectile dysfunction. 04/19/23   Shona Layman BROCKS, MD    Family History History reviewed. No pertinent family history.  Social History Social History   Tobacco Use   Smoking status: Never    Passive exposure: Never   Smokeless tobacco: Never  Vaping Use   Vaping status: Never Used  Substance Use Topics   Alcohol use: Yes    Comment: occ   Drug use: Never     Allergies   Patient has no known allergies.   Review of Systems Review of Systems  HENT:  Positive for ear pain.   Neurological:  Positive for headaches.     Physical Exam Triage Vital Signs ED Triage Vitals  Encounter Vitals Group     BP 10/15/24 1621 (!) 150/88     Girls Systolic BP Percentile --      Girls Diastolic BP Percentile --      Boys Systolic BP Percentile --      Boys Diastolic BP Percentile --  Pulse Rate 10/15/24 1621 76     Resp 10/15/24 1621 18     Temp 10/15/24 1621 98.8 F (37.1 C)     Temp Source 10/15/24 1621 Oral     SpO2 10/15/24 1621 95 %     Weight 10/15/24 1620 215 lb (97.5 kg)     Height --      Head Circumference --      Peak Flow --      Pain Score 10/15/24 1619 6     Pain Loc --      Pain Education --      Exclude from Growth Chart --    No data found.  Updated Vital Signs BP (!) 150/88 (BP Location: Right Arm)   Pulse 76   Temp 98.8 F (37.1 C) (Oral)   Resp 18   Wt 215 lb (97.5 kg)   SpO2 95%   BMI 32.69 kg/m   Visual Acuity Right Eye Distance:   Left Eye Distance:   Bilateral Distance:    Right Eye Near:   Left Eye Near:    Bilateral Near:     Physical Exam Vitals and nursing note reviewed.   Constitutional:      General: He is not in acute distress.    Appearance: Normal appearance. He is not ill-appearing, toxic-appearing or diaphoretic.  HENT:     Right Ear: Tympanic membrane, ear canal and external ear normal.     Left Ear: Tympanic membrane, ear canal and external ear normal.     Nose: No congestion or rhinorrhea.     Mouth/Throat:     Mouth: Mucous membranes are moist.     Pharynx: Oropharynx is clear. No oropharyngeal exudate or posterior oropharyngeal erythema.  Cardiovascular:     Rate and Rhythm: Normal rate and regular rhythm.     Heart sounds: Normal heart sounds.  Pulmonary:     Effort: Pulmonary effort is normal. No respiratory distress.     Breath sounds: Normal breath sounds. No wheezing or rhonchi.  Musculoskeletal:     Cervical back: Tenderness present.  Skin:    General: Skin is warm.  Neurological:     Mental Status: He is alert and oriented to person, place, and time.  Psychiatric:        Mood and Affect: Mood normal.        Behavior: Behavior normal.      UC Treatments / Results  Labs (all labs ordered are listed, but only abnormal results are displayed) Labs Reviewed  POCT RAPID STREP A (OFFICE) - Normal  POC COVID19/FLU A&B COMBO    EKG   Radiology No results found.  Procedures Procedures (including critical care time)  Medications Ordered in UC Medications - No data to display  Initial Impression / Assessment and Plan / UC Course  I have reviewed the triage vital signs and the nursing notes.  Pertinent labs & imaging results that were available during my care of the patient were reviewed by me and considered in my medical decision making (see chart for details).    Final Clinical Impressions(s) / UC Diagnoses   Final diagnoses:  Viral URI   Discharge Instructions   None    ED Prescriptions   None    PDMP not reviewed this encounter.   Andra Corean BROCKS, PA-C 10/15/24 1710
